# Patient Record
Sex: Female | Born: 1990 | Race: White | Marital: Married | Smoking: Never smoker
Health system: Southern US, Community
[De-identification: ages and names within clinical notes are randomized; demographics above are authoritative.]

## PROBLEM LIST (undated history)

## (undated) DIAGNOSIS — Z789 Other specified health status: Secondary | ICD-10-CM

## (undated) HISTORY — DX: Other specified health status: Z78.9

## (undated) HISTORY — PX: NO PAST SURGERIES: SHX2092

---

## 2016-12-03 ENCOUNTER — Ambulatory Visit (INDEPENDENT_AMBULATORY_CARE_PROVIDER_SITE_OTHER): Payer: Self-pay | Admitting: General Practice

## 2016-12-03 DIAGNOSIS — Z3201 Encounter for pregnancy test, result positive: Secondary | ICD-10-CM

## 2016-12-03 NOTE — Progress Notes (Signed)
Patient here for UPT today. UPT +. Patient reports first positive home test 11/25/16. LMP 10/10/16 EDD 07/17/17 4324w5d. Patient denies taking any medications or vitamins. Recommended she begin PNV & start care. Patient wishes to come here. New ob packet given. Patient had no questions

## 2017-01-07 ENCOUNTER — Encounter: Payer: Self-pay | Admitting: Obstetrics and Gynecology

## 2017-01-07 ENCOUNTER — Other Ambulatory Visit: Payer: Self-pay

## 2017-01-07 ENCOUNTER — Ambulatory Visit (INDEPENDENT_AMBULATORY_CARE_PROVIDER_SITE_OTHER): Payer: Self-pay

## 2017-01-07 VITALS — BP 127/78 | HR 85 | Ht 67.0 in | Wt 180.6 lb

## 2017-01-07 DIAGNOSIS — Z1371 Encounter for nonprocreative screening for genetic disease carrier status: Secondary | ICD-10-CM

## 2017-01-07 DIAGNOSIS — Z113 Encounter for screening for infections with a predominantly sexual mode of transmission: Secondary | ICD-10-CM

## 2017-01-07 DIAGNOSIS — Z124 Encounter for screening for malignant neoplasm of cervix: Secondary | ICD-10-CM

## 2017-01-07 DIAGNOSIS — Z3402 Encounter for supervision of normal first pregnancy, second trimester: Secondary | ICD-10-CM

## 2017-01-07 DIAGNOSIS — Z3400 Encounter for supervision of normal first pregnancy, unspecified trimester: Secondary | ICD-10-CM | POA: Insufficient documentation

## 2017-01-07 DIAGNOSIS — Z23 Encounter for immunization: Secondary | ICD-10-CM

## 2017-01-07 LAB — POCT URINALYSIS DIP (DEVICE)
BILIRUBIN URINE: NEGATIVE
GLUCOSE, UA: NEGATIVE mg/dL
Hgb urine dipstick: NEGATIVE
Ketones, ur: NEGATIVE mg/dL
LEUKOCYTES UA: NEGATIVE
NITRITE: NEGATIVE
PH: 6.5 (ref 5.0–8.0)
Protein, ur: NEGATIVE mg/dL
Specific Gravity, Urine: 1.01 (ref 1.005–1.030)
Urobilinogen, UA: 0.2 mg/dL (ref 0.0–1.0)

## 2017-01-07 MED ORDER — ASPIRIN EC 81 MG PO TBEC: 81.0000 mg | DELAYED_RELEASE_TABLET | Freq: Every day | ORAL | 5 refills | Status: DC

## 2017-01-07 NOTE — Addendum Note (Signed)
Addended by: Garret ReddishBARNES, Deandre Brannan M on: 01/07/2017 04:59 PM   Modules accepted: Orders, SmartSet

## 2017-01-07 NOTE — Progress Notes (Signed)
  Subjective:    Meghan Ibarra is being seen today for her first obstetrical visit.  This is a planned pregnancy. She is at 4351w5d gestation. Relationship with FOB: spouse, living together. Patient does intend to breast feed. Pregnancy history fully reviewed.  Patient reports no complaints.  Review of Systems:   Review of Systems  Constitutional: Negative.  Negative for fatigue and fever.  HENT: Negative.   Respiratory: Negative.  Negative for shortness of breath.   Cardiovascular: Negative.  Negative for chest pain.  Gastrointestinal: Negative.  Negative for abdominal pain, constipation, diarrhea, nausea and vomiting.  Genitourinary: Negative.  Negative for dysuria.  Neurological: Negative.  Negative for dizziness and headaches.    Objective:     BP 127/78   Pulse 85   Ht 5\' 7"  (1.702 m)   Wt 180 lb 9.6 oz (81.9 kg)   LMP 10/10/2016   BMI 28.29 kg/m  Physical Exam  Nursing note and vitals reviewed. Constitutional: She is oriented to person, place, and time. She appears well-developed and well-nourished. No distress.  HENT:  Head: Normocephalic.  Eyes: Pupils are equal, round, and reactive to light.  Cardiovascular: Normal rate, regular rhythm and normal heart sounds.  Respiratory: Effort normal and breath sounds normal. No respiratory distress.  GI: Soft. Bowel sounds are normal. She exhibits no distension. There is no tenderness.  Neurological: She is alert and oriented to person, place, and time.  Skin: Skin is warm and dry.  Psychiatric: She has a normal mood and affect. Her behavior is normal. Judgment and thought content normal.    Pelvic exam: Cervix pink, visually closed, without lesion, scant white creamy discharge, vaginal walls and external genitalia normal Bimanual exam: Cervix 0/long/high, firm, anterior, neg CMT, uterus nontender, adnexa without tenderness, enlargement, or mass  Exam Unable to hear FHT with doppler- Dr. Macon Ibarra performed u/s and confirmed  FHR   Assessment:    Pregnancy: G1P0 Patient Active Problem List   Diagnosis Date Noted  . Supervision of normal first pregnancy, antepartum 01/07/2017     Plan:     Initial labs drawn. Prenatal vitamins. 1hr GTT today- father with diabetes Aspirin 81mg  daily per Up to Date criteria- nulliparity and family hx preeclampsia Problem list reviewed and updated. AFP3 discussed: requested. Role of ultrasound in pregnancy discussed; fetal survey: requested. Amniocentesis discussed: not indicated. Follow up in 4 weeks.  Meghan Ibarra CNM 01/07/2017 4:47 PM

## 2017-01-08 LAB — OBSTETRIC PANEL, INCLUDING HIV
ANTIBODY SCREEN: NEGATIVE
BASOS: 0 %
Basophils Absolute: 0 10*3/uL (ref 0.0–0.2)
EOS (ABSOLUTE): 0.1 10*3/uL (ref 0.0–0.4)
EOS: 1 %
HEMATOCRIT: 36.4 % (ref 34.0–46.6)
HEMOGLOBIN: 12.2 g/dL (ref 11.1–15.9)
HIV Screen 4th Generation wRfx: NONREACTIVE
Hepatitis B Surface Ag: NEGATIVE
IMMATURE GRANS (ABS): 0 10*3/uL (ref 0.0–0.1)
Immature Granulocytes: 0 %
LYMPHS: 23 %
Lymphocytes Absolute: 1.9 10*3/uL (ref 0.7–3.1)
MCH: 29.9 pg (ref 26.6–33.0)
MCHC: 33.5 g/dL (ref 31.5–35.7)
MCV: 89 fL (ref 79–97)
MONOCYTES: 8 %
Monocytes Absolute: 0.7 10*3/uL (ref 0.1–0.9)
NEUTROS ABS: 5.4 10*3/uL (ref 1.4–7.0)
Neutrophils: 68 %
Platelets: 282 10*3/uL (ref 150–379)
RBC: 4.08 x10E6/uL (ref 3.77–5.28)
RDW: 13.9 % (ref 12.3–15.4)
RH TYPE: NEGATIVE
RPR Ser Ql: NONREACTIVE
RUBELLA: 3.71 {index} (ref >0.99)
WBC: 8 10*3/uL (ref 3.4–10.8)

## 2017-01-08 LAB — HEMOGLOBINOPATHY EVALUATION
Ferritin: 14 ng/mL — ABNORMAL LOW (ref 15–150)
HGB A2 QUANT: 2.3 % (ref 1.8–3.2)
HGB A: 96.9 % (ref 96.4–98.8)
HGB C: 0 %
HGB S: 0 %
HGB SOLUBILITY: NEGATIVE
HGB VARIANT: 0 %
Hgb F Quant: 0.8 % (ref 0.0–2.0)

## 2017-01-08 LAB — GLUCOSE TOLERANCE, 1 HOUR: Glucose, 1Hr PP: 70 mg/dL (ref 65–199)

## 2017-01-09 LAB — CULTURE, OB URINE

## 2017-01-09 LAB — URINE CULTURE, OB REFLEX

## 2017-01-12 LAB — CYTOLOGY - PAP
Chlamydia: NEGATIVE
DIAGNOSIS: NEGATIVE
Neisseria Gonorrhea: NEGATIVE

## 2017-01-16 ENCOUNTER — Encounter: Payer: Self-pay | Admitting: Obstetrics and Gynecology

## 2017-01-16 DIAGNOSIS — Z6791 Unspecified blood type, Rh negative: Secondary | ICD-10-CM | POA: Insufficient documentation

## 2017-01-16 DIAGNOSIS — O26899 Other specified pregnancy related conditions, unspecified trimester: Secondary | ICD-10-CM

## 2017-01-16 LAB — CYSTIC FIBROSIS MUTATION 97: GENE DIS ANAL CARRIER INTERP BLD/T-IMP: NOT DETECTED

## 2017-02-04 ENCOUNTER — Ambulatory Visit (INDEPENDENT_AMBULATORY_CARE_PROVIDER_SITE_OTHER): Payer: Self-pay

## 2017-02-04 VITALS — BP 126/80 | HR 84 | Wt 179.8 lb

## 2017-02-04 DIAGNOSIS — N949 Unspecified condition associated with female genital organs and menstrual cycle: Secondary | ICD-10-CM

## 2017-02-04 DIAGNOSIS — Z3492 Encounter for supervision of normal pregnancy, unspecified, second trimester: Secondary | ICD-10-CM

## 2017-02-04 DIAGNOSIS — Z3400 Encounter for supervision of normal first pregnancy, unspecified trimester: Secondary | ICD-10-CM

## 2017-02-04 DIAGNOSIS — Z3490 Encounter for supervision of normal pregnancy, unspecified, unspecified trimester: Secondary | ICD-10-CM

## 2017-02-04 NOTE — Progress Notes (Signed)
   PRENATAL VISIT NOTE  Subjective:  Meghan Ibarra is a 26 y.o. G1P0 at 7667w5d being seen today for ongoing prenatal care.  She is currently monitored for the following issues for this low-risk pregnancy and has Supervision of normal first pregnancy, antepartum and Rh negative, antepartum on their problem list.  Patient reports lower abdominal pain with movement. 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 Reports pain with movement and changing positions.  Contractions: Not present. Vag. Bleeding: None.   . Denies leaking of fluid.   The following portions of the patient's history were reviewed and updated as appropriate: allergies, current medications, past family history, past medical history, past social history, past surgical history and problem list. Problem list updated.  Objective:   Vitals:   02/04/17 0839  BP: 126/80  Pulse: 84  Weight: 179 lb 12.8 oz (81.6 kg)    Fetal Status: Fetal Heart Rate (bpm): 160 Fundal Height: 17 cm       General:  Alert, oriented and cooperative. Patient is in no acute distress.  Skin: Skin is warm and dry. No rash noted.   Cardiovascular: Normal heart rate noted  Respiratory: Normal respiratory effort, no problems with respiration noted  Abdomen: Soft, gravid, appropriate for gestational age.  Pain/Pressure: Present     Pelvic: Cervical exam deferred        Extremities: Normal range of motion.  Edema: None  Mental Status:  Normal mood and affect. Normal behavior. Normal judgment and thought content.   Assessment and Plan:  Pregnancy: G1P0 at 2067w5d  1. Encounter for supervision of low-risk pregnancy, antepartum -Initial OB labs reveiwed - AFP TETRA - US MFM OB COMP + 14 WK; Future  2. Round ligament pain -Encouraged patient to purchase maternity support belt -Increase water while at work  Preterm labor symptoms and general obstetric precautions including but not limited to vaginal bleeding, contractions,  leaking of fluid and fetal movement were reviewed in detail with the patient. Please refer to After Visit Summary for other counseling recommendations.  Return in about 4 weeks (around 03/04/2017) for Return OB visit.  Rolm BookbinderCaroline M Neill, CNM  02/04/17 8:59 AM

## 2017-02-04 NOTE — Patient Instructions (Signed)
Round Ligament Pain The round ligament is a cord of muscle and tissue that helps to support the uterus. It can become a source of pain during pregnancy if it becomes stretched or twisted as the baby grows. The pain usually begins in the second trimester of pregnancy, and it can come and go until the baby is delivered. It is not a serious problem, and it does not cause harm to the baby. Round ligament pain is usually a short, sharp, and pinching pain, but it can also be a dull, lingering, and aching pain. The pain is felt in the lower side of the abdomen or in the groin. It usually starts deep in the groin and moves up to the outside of the hip area. Pain can occur with:  A sudden change in position.  Rolling over in bed.  Coughing or sneezing.  Physical activity.  Follow these instructions at home: Watch your condition for any changes. Take these steps to help with your pain:  When the pain starts, relax. Then try: ? Sitting down. ? Flexing your knees up to your abdomen. ? Lying on your side with one pillow under your abdomen and another pillow between your legs. ? Sitting in a warm bath for 15-20 minutes or until the pain goes away.  Take over-the-counter and prescription medicines only as told by your health care provider.  Move slowly when you sit and stand.  Avoid long walks if they cause pain.  Stop or lessen your physical activities if they cause pain.  Contact a health care provider if:  Your pain does not go away with treatment.  You feel pain in your back that you did not have before.  Your medicine is not helping. Get help right away if:  You develop a fever or chills.  You develop uterine contractions.  You develop vaginal bleeding.  You develop nausea or vomiting.  You develop diarrhea.  You have pain when you urinate. This information is not intended to replace advice given to you by your health care provider. Make sure you discuss any questions you have  with your health care provider. Document Released: 11/13/2007 Document Revised: 07/12/2015 Document Reviewed: 04/12/2014 Elsevier Interactive Patient Education  2018 [employer] of Pregnancy The second trimester is from week 14 through week 27 (months 4 through 6). The second trimester is often a time when you feel your best. Your body has adjusted to being pregnant, and you begin to feel better physically. Usually, morning sickness has lessened or quit completely, you may have more energy, and you may have an increase in appetite. The second trimester is also a time when the fetus is growing rapidly. At the end of the sixth month, the fetus is about 9 inches long and weighs about 1 pounds. You will likely begin to feel the baby move (quickening) between 16 and 20 weeks of pregnancy. Body changes during your second trimester Your body continues to go through many changes during your second trimester. The changes vary from woman to woman.  Your weight will continue to increase. You will notice your lower abdomen bulging out.  You may begin to get stretch marks on your hips, abdomen, and breasts.  You may develop headaches that can be relieved by medicines. The medicines should be approved by your health care provider.  You may urinate more often because the fetus is pressing on your bladder.  You may develop or continue to have heartburn as a result of your  pregnancy.  You may develop constipation because certain hormones are causing the muscles that push waste through your intestines to slow down.  You may develop hemorrhoids or swollen, bulging veins (varicose veins).  You may have back pain. This is caused by: ? Weight gain. ? Pregnancy hormones that are relaxing the joints in your pelvis. ? A shift in weight and the muscles that support your balance.  Your breasts will continue to grow and they will continue to become tender.  Your gums may bleed and may be  sensitive to brushing and flossing.  Dark spots or blotches (chloasma, mask of pregnancy) may develop on your face. This will likely fade after the baby is born.  A dark line from your belly button to the pubic area (linea nigra) may appear. This will likely fade after the baby is born.  You may have changes in your hair. These can include thickening of your hair, rapid growth, and changes in texture. Some women also have hair loss during or after pregnancy, or hair that feels dry or thin. Your hair will most likely return to normal after your baby is born.  What to expect at prenatal visits During a routine prenatal visit:  You will be weighed to make sure you and the fetus are growing normally.  Your blood pressure will be taken.  Your abdomen will be measured to track your baby's growth.  The fetal heartbeat will be listened to.  Any test results from the previous visit will be discussed.  Your health care provider may ask you:  How you are feeling.  If you are feeling the baby move.  If you have had any abnormal symptoms, such as leaking fluid, bleeding, severe headaches, or abdominal cramping.  If you are using any tobacco products, including cigarettes, chewing tobacco, and electronic cigarettes.  If you have any questions.  Other tests that may be performed during your second trimester include:  Blood tests that check for: ? Low iron levels (anemia). ? High blood sugar that affects pregnant women (gestational diabetes) between 51 and 28 weeks. ? Rh antibodies. This is to check for a protein on red blood cells (Rh factor).  Urine tests to check for infections, diabetes, or protein in the urine.  An ultrasound to confirm the proper growth and development of the baby.  An amniocentesis to check for possible genetic problems.  Fetal screens for spina bifida and Down syndrome.  HIV (human immunodeficiency virus) testing. Routine prenatal testing includes screening  for HIV, unless you choose not to have this test.  Follow these instructions at home: Medicines  Follow your health care provider's instructions regarding medicine use. Specific medicines may be either safe or unsafe to take during pregnancy.  Take a prenatal vitamin that contains at least 600 micrograms (mcg) of folic acid.  If you develop constipation, try taking a stool softener if your health care provider approves. Eating and drinking  Eat a balanced diet that includes fresh fruits and vegetables, whole grains, good sources of protein such as meat, eggs, or tofu, and low-fat dairy. Your health care provider will help you determine the amount of weight gain that is right for you.  Avoid raw meat and uncooked cheese. These carry germs that can cause birth defects in the baby.  If you have low calcium intake from food, talk to your health care provider about whether you should take a daily calcium supplement.  Limit foods that are high in fat and processed sugars,  such as fried and sweet foods.  To prevent constipation: ? Drink enough fluid to keep your urine clear or pale yellow. ? Eat foods that are high in fiber, such as fresh fruits and vegetables, whole grains, and beans. Activity  Exercise only as directed by your health care provider. Most women can continue their usual exercise routine during pregnancy. Try to exercise for 30 minutes at least 5 days a week. Stop exercising if you experience uterine contractions.  Avoid heavy lifting, wear low heel shoes, and practice good posture.  A sexual relationship may be continued unless your health care provider directs you otherwise. Relieving pain and discomfort  Wear a good support bra to prevent discomfort from breast tenderness.  Take warm sitz baths to soothe any pain or discomfort caused by hemorrhoids. Use hemorrhoid cream if your health care provider approves.  Rest with your legs elevated if you have leg cramps or low  back pain.  If you develop varicose veins, wear support hose. Elevate your feet for 15 minutes, 3-4 times a day. Limit salt in your diet. Prenatal Care  Write down your questions. Take them to your prenatal visits.  Keep all your prenatal visits as told by your health care provider. This is important. Safety  Wear your seat belt at all times when driving.  Make a list of emergency phone numbers, including numbers for family, friends, the hospital, and police and fire departments. General instructions  Ask your health care provider for a referral to a local prenatal education class. Begin classes no later than the beginning of month 6 of your pregnancy.  Ask for help if you have counseling or nutritional needs during pregnancy. Your health care provider can offer advice or refer you to specialists for help with various needs.  Do not use hot tubs, steam rooms, or saunas.  Do not douche or use tampons or scented sanitary pads.  Do not cross your legs for long periods of time.  Avoid cat litter boxes and soil used by cats. These carry germs that can cause birth defects in the baby and possibly loss of the fetus by miscarriage or stillbirth.  Avoid all smoking, herbs, alcohol, and unprescribed drugs. Chemicals in these products can affect the formation and growth of the baby.  Do not use any products that contain nicotine or tobacco, such as cigarettes and e-cigarettes. If you need help quitting, ask your health care provider.  Visit your dentist if you have not gone yet during your pregnancy. Use a soft toothbrush to brush your teeth and be gentle when you floss. Contact a health care provider if:  You have dizziness.  You have mild pelvic cramps, pelvic pressure, or nagging pain in the abdominal area.  You have persistent nausea, vomiting, or diarrhea.  You have a bad smelling vaginal discharge.  You have pain when you urinate. Get help right away if:  You have a  fever.  You are leaking fluid from your vagina.  You have spotting or bleeding from your vagina.  You have severe abdominal cramping or pain.  You have rapid weight gain or weight loss.  You have shortness of breath with chest pain.  You notice sudden or extreme swelling of your face, hands, ankles, feet, or legs.  You have not felt your baby move in over an hour.  You have severe headaches that do not go away when you take medicine.  You have vision changes. Summary  The second trimester is from week  14 through week 27 (months 4 through 6). It is also a time when the fetus is growing rapidly.  Your body goes through many changes during pregnancy. The changes vary from woman to woman.  Avoid all smoking, herbs, alcohol, and unprescribed drugs. These chemicals affect the formation and growth your baby.  Do not use any tobacco products, such as cigarettes, chewing tobacco, and e-cigarettes. If you need help quitting, ask your health care provider.  Contact your health care provider if you have any questions. Keep all prenatal visits as told by your health care provider. This is important. This information is not intended to replace advice given to you by your health care provider. Make sure you discuss any questions you have with your health care provider. Document Released: 01/28/2001 Document Revised: 03/11/2016 Document Reviewed: 03/11/2016 Elsevier Interactive Patient Education  [employer]2018 [employer] Inc[employer]

## 2017-02-04 NOTE — Progress Notes (Signed)
Anatomy US scheduled for January 4th @0845 .  Pt notified.

## 2017-02-07 LAB — AFP TETRA
DIA Mom Value: 0.71
DIA VALUE (EIA): 107.53 pg/mL
DSR (By Age)    1 IN: 898
DSR (Second Trimester) 1 IN: 6980
GESTATIONAL AGE AFP: 16.7 wk
MSAFP Mom: 0.78
MSAFP: 24.6 ng/mL
MSHCG MOM: 1.06
MSHCG: 31374 m[IU]/mL
Maternal Age At EDD: 27.4 yr
Osb Risk: 10000
TEST RESULTS AFP: NEGATIVE
UE3 MOM: 0.95
Weight: 179 [lb_av]
uE3 Value: 0.87 ng/mL

## 2017-02-16 ENCOUNTER — Encounter (HOSPITAL_COMMUNITY): Payer: Self-pay

## 2017-02-17 NOTE — L&D Delivery Note (Addendum)
Delivery Note At 11:00 AM a viable female was delivered via Vaginal, Spontaneous (Presentation: OA).  APGAR: 8, 9; weight 8 lb 5.7 oz (3790 g).   Placenta status: intact; spontaneous via Tomasa BlaseSchultz.  Cord: 3 VC with no complications.  Cord pH: n/a  Anesthesia: epidural Episiotomy: none Lacerations: 4th degree by Dr. Alysia PennaErvin -- see addendum Suture Repair: 2.0 3.0 chromic vicryl Est. Blood Loss (mL): 250  Mom to postpartum.  Baby to Couplet care / Skin to Skin.  Raelyn Moraolitta Dawson, MSN, CNM 07/20/17, 12:42 PM    As noted above.    The 4th degree laceration was closed with 3/0 Chromic in a 2 layer fashion. The 3rd degree laceration was repaired with 2/0 Vicryl. The remainder of the vaginal mucosa and perineal body were closed with 2/0 and 3/0 Vicryl. There were also 2 small labial lacerations which were closed with 4/0 Vicryl. Rectal examined afterwards confirmed good closure and sphincter tone.  Pt was instructed on ice pack use as well as stool softener. She will be seen in the clinic in 1 week for a follow up visit.  Nettie ElmMichael Breyon Blass, MD

## 2017-02-20 ENCOUNTER — Other Ambulatory Visit: Payer: Self-pay

## 2017-02-20 ENCOUNTER — Ambulatory Visit (HOSPITAL_COMMUNITY)
Admission: RE | Admit: 2017-02-20 | Discharge: 2017-02-20 | Disposition: A | Discharge status: Home / Self Care | Payer: BLUE CROSS/BLUE SHIELD | Source: Ambulatory Visit

## 2017-02-20 DIAGNOSIS — Z369 Encounter for antenatal screening, unspecified: Secondary | ICD-10-CM

## 2017-02-20 DIAGNOSIS — Z3A19 19 weeks gestation of pregnancy: Secondary | ICD-10-CM

## 2017-02-20 DIAGNOSIS — Z363 Encounter for antenatal screening for malformations: Secondary | ICD-10-CM | POA: Diagnosis present

## 2017-02-20 DIAGNOSIS — Z3490 Encounter for supervision of normal pregnancy, unspecified, unspecified trimester: Secondary | ICD-10-CM

## 2017-03-04 ENCOUNTER — Ambulatory Visit (INDEPENDENT_AMBULATORY_CARE_PROVIDER_SITE_OTHER): Payer: BLUE CROSS/BLUE SHIELD

## 2017-03-04 VITALS — BP 116/87 | HR 99 | Wt 182.4 lb

## 2017-03-04 DIAGNOSIS — N949 Unspecified condition associated with female genital organs and menstrual cycle: Secondary | ICD-10-CM

## 2017-03-04 DIAGNOSIS — Z3402 Encounter for supervision of normal first pregnancy, second trimester: Secondary | ICD-10-CM

## 2017-03-04 DIAGNOSIS — Z3400 Encounter for supervision of normal first pregnancy, unspecified trimester: Secondary | ICD-10-CM

## 2017-03-04 NOTE — Progress Notes (Signed)
   PRENATAL VISIT NOTE  Subjective:  Meghan Ibarra is a 27 y.o. G1P0 at 5641w5d being seen today for ongoing prenatal care.  She is currently monitored for the following issues for this low-risk pregnancy and has Supervision of normal first pregnancy, antepartum and Rh negative, antepartum on their problem list.  Patient reports cold symptoms.  Contractions: Not present. Vag. Bleeding: None.  Movement: Present. Denies leaking of fluid.   The following portions of the patient's history were reviewed and updated as appropriate: allergies, current medications, past family history, past medical history, past social history, past surgical history and problem list. Problem list updated.  Objective:   Vitals:   03/04/17 0949  BP: 116/87  Pulse: 99  Weight: 182 lb 6.4 oz (82.7 kg)    Fetal Status: Fetal Heart Rate (bpm): 152 Fundal Height: 21 cm Movement: Present     General:  Alert, oriented and cooperative. Patient is in no acute distress.  Skin: Skin is warm and dry. No rash noted.   Cardiovascular: Normal heart rate noted  Respiratory: Normal respiratory effort, no problems with respiration noted  Abdomen: Soft, gravid, appropriate for gestational age.  Pain/Pressure: Absent     Pelvic: Cervical exam deferred        Extremities: Normal range of motion.  Edema: None  Mental Status:  Normal mood and affect. Normal behavior. Normal judgment and thought content.   Assessment and Plan:  Pregnancy: G1P0 at 6041w5d  1. Supervision of normal first pregnancy, antepartum -Reviewed results of anatomy ultrasound and need for follow up in 4 weeks - US MFM OB FOLLOW UP; Future  -List of safe OTC medications given, reviewed s/s of flu  2. Round ligament pain -Patient reports pain is better, only felt intermittently  Preterm labor symptoms and general obstetric precautions including but not limited to vaginal bleeding, contractions, leaking of fluid and fetal movement were reviewed in detail  with the patient. Please refer to After Visit Summary for other counseling recommendations.  Return in about 4 weeks (around 04/01/2017) for Return OB visit.  Meghan Ibarra, CNM 03/04/17 10:10 AM

## 2017-03-04 NOTE — Progress Notes (Signed)
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

## 2017-03-04 NOTE — Patient Instructions (Signed)

## 2017-03-18 ENCOUNTER — Other Ambulatory Visit: Payer: Self-pay

## 2017-03-18 ENCOUNTER — Ambulatory Visit (HOSPITAL_COMMUNITY)
Admission: RE | Admit: 2017-03-18 | Discharge: 2017-03-18 | Disposition: A | Discharge status: Home / Self Care | Payer: BLUE CROSS/BLUE SHIELD | Source: Ambulatory Visit

## 2017-03-18 DIAGNOSIS — Z3686 Encounter for antenatal screening for cervical length: Secondary | ICD-10-CM

## 2017-03-18 DIAGNOSIS — Z362 Encounter for other antenatal screening follow-up: Secondary | ICD-10-CM | POA: Insufficient documentation

## 2017-03-18 DIAGNOSIS — Z3402 Encounter for supervision of normal first pregnancy, second trimester: Secondary | ICD-10-CM | POA: Diagnosis not present

## 2017-03-18 DIAGNOSIS — Z3400 Encounter for supervision of normal first pregnancy, unspecified trimester: Secondary | ICD-10-CM

## 2017-03-18 DIAGNOSIS — Z3A22 22 weeks gestation of pregnancy: Secondary | ICD-10-CM

## 2017-04-01 ENCOUNTER — Encounter: Payer: Self-pay | Admitting: Medical

## 2017-04-01 ENCOUNTER — Ambulatory Visit (INDEPENDENT_AMBULATORY_CARE_PROVIDER_SITE_OTHER): Payer: BLUE CROSS/BLUE SHIELD | Admitting: Medical

## 2017-04-01 VITALS — BP 124/69 | HR 83 | Wt 190.4 lb

## 2017-04-01 DIAGNOSIS — Z3400 Encounter for supervision of normal first pregnancy, unspecified trimester: Secondary | ICD-10-CM

## 2017-04-01 DIAGNOSIS — O09892 Supervision of other high risk pregnancies, second trimester: Secondary | ICD-10-CM

## 2017-04-01 DIAGNOSIS — O26899 Other specified pregnancy related conditions, unspecified trimester: Secondary | ICD-10-CM

## 2017-04-01 DIAGNOSIS — Z3402 Encounter for supervision of normal first pregnancy, second trimester: Secondary | ICD-10-CM

## 2017-04-01 DIAGNOSIS — Z6791 Unspecified blood type, Rh negative: Secondary | ICD-10-CM

## 2017-04-01 NOTE — Progress Notes (Signed)
   PRENATAL VISIT NOTE  Subjective:  Wyatt PortelaBrittany Folden is a 27 y.o. G1P0 at [redacted]w[redacted]d being seen today for ongoing prenatal care.  She is currently monitored for the following issues for this low-risk pregnancy and has Supervision of normal first pregnancy, antepartum and Rh negative, antepartum on their problem list.  Patient reports no complaints.  Contractions: Not present. Vag. Bleeding: None.  Movement: Present. Denies leaking of fluid.   The following portions of the patient's history were reviewed and updated as appropriate: allergies, current medications, past family history, past medical history, past social history, past surgical history and problem list. Problem list updated.  Objective:   Vitals:   04/01/17 0817  BP: 124/69  Pulse: 83  Weight: 190 lb 6.4 oz (86.4 kg)    Fetal Status: Fetal Heart Rate (bpm): 158 Fundal Height: 25 cm Movement: Present     General:  Alert, oriented and cooperative. Patient is in no acute distress.  Skin: Skin is warm and dry. No rash noted.   Cardiovascular: Normal heart rate noted  Respiratory: Normal respiratory effort, no problems with respiration noted  Abdomen: Soft, gravid, appropriate for gestational age.  Pain/Pressure: Absent     Pelvic: Cervical exam deferred        Extremities: Normal range of motion.  Edema: None  Mental Status:  Normal mood and affect. Normal behavior. Normal judgment and thought content.   Assessment and Plan:  Pregnancy: G1P0 at 246w5d  1. Supervision of normal first pregnancy, antepartum - Doing well, no complaints - Discussed need for 2 hour GTT, CBC, HIV and RPR at next visit - Discussed TDap at next visit   2. Rh negative, antepartum - Discussed need for Rhogam at next visit  Preterm labor symptoms and general obstetric precautions including but not limited to vaginal bleeding, contractions, leaking of fluid and fetal movement were reviewed in detail with the patient. Please refer to After Visit Summary  for other counseling recommendations.  Return in about 4 weeks (around 04/29/2017) for LOB, 28 week labs (fasting).   Vonzella NippleJulie Schylar Allard, PA-C

## 2017-04-01 NOTE — Patient Instructions (Addendum)
Second Trimester of Pregnancy The second trimester is from week 13 through week 28, month 4 through 6. This is often the time in pregnancy that you feel your best. Often times, morning sickness has lessened or quit. You may have more energy, and you may get hungry more often. Your unborn baby (fetus) is growing rapidly. At the end of the sixth month, he or she is about 9 inches long and weighs about 1 pounds. You will likely feel the baby move (quickening) between 18 and 20 weeks of pregnancy. Follow these instructions at home:  Avoid all smoking, herbs, and alcohol. Avoid drugs not approved by your doctor.  Do not use any tobacco products, including cigarettes, chewing tobacco, and electronic cigarettes. If you need help quitting, ask your doctor. You may get counseling or other support to help you quit.  Only take medicine as told by your doctor. Some medicines are safe and some are not during pregnancy.  Exercise only as told by your doctor. Stop exercising if you start having cramps.  Eat regular, healthy meals.  Wear a good support bra if your breasts are tender.  Do not use hot tubs, steam rooms, or saunas.  Wear your seat belt when driving.  Avoid raw meat, uncooked cheese, and liter boxes and soil used by cats.  Take your prenatal vitamins.  Take 1500-2000 milligrams of calcium daily starting at the 20th week of pregnancy until you deliver your baby.  Try taking medicine that helps you poop (stool softener) as needed, and if your doctor approves. Eat more fiber by eating fresh fruit, vegetables, and whole grains. Drink enough fluids to keep your pee (urine) clear or pale yellow.  Take warm water baths (sitz baths) to soothe pain or discomfort caused by hemorrhoids. Use hemorrhoid cream if your doctor approves.  If you have puffy, bulging veins (varicose veins), wear support hose. Raise (elevate) your feet for 15 minutes, 3-4 times a day. Limit salt in your diet.  Avoid heavy  lifting, wear low heals, and sit up straight.  Rest with your legs raised if you have leg cramps or low back pain.  Visit your dentist if you have not gone during your pregnancy. Use a soft toothbrush to brush your teeth. Be gentle when you floss.  You can have sex (intercourse) unless your doctor tells you not to.  Go to your doctor visits. Get help if:  You feel dizzy.  You have mild cramps or pressure in your lower belly (abdomen).  You have a nagging pain in your belly area.  You continue to feel sick to your stomach (nauseous), throw up (vomit), or have watery poop (diarrhea).  You have bad smelling fluid coming from your vagina.  You have pain with peeing (urination). Get help right away if:  You have a fever.  You are leaking fluid from your vagina.  You have spotting or bleeding from your vagina.  You have severe belly cramping or pain.  You lose or gain weight rapidly.  You have trouble catching your breath and have chest pain.  You notice sudden or extreme puffiness (swelling) of your face, hands, ankles, feet, or legs.  You have not felt the baby move in over an hour.  You have severe headaches that do not go away with medicine.  You have vision changes. This information is not intended to replace advice given to you by your health care provider. Make sure you discuss any questions you have with your health care  provider. Document Released: 04/30/2009 Document Revised: 07/12/2015 Document Reviewed: 04/06/2012 Elsevier Interactive Patient Education  2017 [place] Education Options: [facility] Department Classes:  Childbirth education classes can help you get ready for a positive parenting experience. You can also meet other expectant parents and get free stuff for your baby. Each class runs for five weeks on the same night and costs $45 for the mother-to-be and her support person. Medicaid covers the cost if you are eligible.  Call [phone] to register. [facility] Childbirth Education:  [phone] or [phone] or [email or web address]  Baby & Me Class: Discuss newborn & infant parenting and family adjustment issues with other new mothers in a relaxed environment. Each week brings a new speaker or baby-centered activity. We encourage new mothers to join [place] every Thursday at 11:00am. Babies birth until crawling. No registration or fee. Daddy [employer]: This course offers Dads-to-be the tools and knowledge needed to feel confident on their journey to becoming new fathers. Experienced dads, who have been trained as coaches, teach dads-to-be how to hold, comfort, diaper, swaddle and play with their infant while being able to support the new mom as well. A class for men taught by men. $25/dad Big Brother/Big Sister: Let your children share in the joy of a new brother or sister in this special class designed just for them. Class includes discussion about how families care for babies: swaddling, holding, diapering, safety as well as how they can be helpful in their new role. This class is designed for children ages 67 to 18, but any age is welcome. Please register each child individually. $5/child  Mom Talk: This mom-led group offers support and connection to mothers as they journey through the adjustments and struggles of that sometimes overwhelming first year after the birth of a child. Tuesdays at 10:00am and Thursdays at 6:00pm. Babies welcome. No registration or fee. Breastfeeding Support Group: This group is a mother-to-mother support circle where moms have the opportunity to share their breastfeeding experiences. A Lactation Consultant is present for questions and concerns. Meets each Tuesday at 11:00am. No fee or registration. Breastfeeding Your Baby: Learn what to expect in the first days of breastfeeding your newborn.  This class will help you feel more confident with the skills needed to begin your  breastfeeding experience. Many new mothers are concerned about breastfeeding after leaving the hospital. This class will also address the most common fears and challenges about breastfeeding during the first few weeks, months and beyond. (call for fee) Comfort Techniques and Tour: This 2 hour interactive class will provide you the opportunity to learn & practice hands-on techniques that can help relieve some of the discomfort of labor and encourage your baby to rotate toward the best position for birth. You and your partner will be able to try a variety of labor positions with birth balls and rebozos as well as practice breathing, relaxation, and visualization techniques. A tour of the [facility] is included with this class. $20 per registrant and support person Childbirth Class- Weekend Option: This class is a Weekend version of our Birth & Baby series. It is designed for parents who have a difficult time fitting several weeks of classes into their schedule. It covers the care of your newborn and the basics of labor and childbirth. It also includes a [place] [facility] and lunch. The class is held two consecutive days: beginning on Friday evening from 6:30 - 8:30 p.m. and the  next day, Saturday from 9 a.m. - 4 p.m. (call for fee) Waterbirth Class: Interested in a waterbirth?  This informational class will help you discover whether waterbirth is the right fit for you. Education about waterbirth itself, supplies you would need and how to assemble your support team is what you can expect from this class. Some obstetrical practices require this class in order to pursue a waterbirth. (Not all obstetrical practices offer waterbirth-check with your healthcare provider.) Register only the expectant mom, but you are encouraged to bring your partner to class! Required if planning waterbirth, no fee. Infant/Child CPR: Parents, grandparents, babysitters, and friends  learn Cardio-Pulmonary Resuscitation skills for infants and children. You will also learn how to treat both conscious and unconscious choking in infants and children. This Family & Friends program does not offer certification. Register each participant individually to ensure that enough mannequins are available. (Call for fee) Grandparent Love: Expecting a grandbaby? This class is for you! Learn about the latest infant care and safety recommendations and ways to support your own child as he or she transitions into the parenting role. Taught by Registered Nurses who are childbirth instructors, but most importantly...they are grandmothers too! $10/person. Childbirth Class- Natural Childbirth: This series of 5 weekly classes is for expectant parents who want to learn and practice natural methods of coping with the process of labor and childbirth. Relaxation, breathing, massage, visualization, role of the partner, and helpful positioning are highlighted. Participants learn how to be confident in their body's ability to give birth. This class will empower and help parents make informed decisions about their own care. Includes discussion that will help new parents transition into the immediate postpartum period. [facility] is included. We suggest taking this class between 25-32 weeks, but it's only a recommendation. $75 per registrant and one support person or $30 Medicaid. Childbirth Class- 3 week Series: This option of 3 weekly classes helps you and your labor partner prepare for childbirth. Newborn care, labor & birth, cesarean birth, pain management, and comfort techniques are discussed and a [place] [facility] is included. The class meets at the same time, on the same day of the week for 3 consecutive weeks beginning with the starting date you choose. $60 for registrant and one support person.  Marvelous Multiples: Expecting twins, triplets, or more?  This class covers the differences in labor, birth, parenting, and breastfeeding issues that face multiples' parents. NICU tour is included. Led by a Certified Childbirth Educator who is the mother of twins. No fee. Caring for Baby: This class is for expectant and adoptive parents who want to learn and practice the most up-to-date newborn care for their babies. Focus is on birth through the first six weeks of life. Topics include feeding, bathing, diapering, crying, umbilical cord care, circumcision care and safe sleep. Parents learn to recognize symptoms of illness and when to call the pediatrician. Register only the mom-to-be and your partner or support person can plan to come with you! $10 per registrant and support person Childbirth Class- online option: This online class offers you the freedom to complete a Birth and Baby series in the comfort of your own home. The flexibility of this option allows you to review sections at your own pace, at times convenient to you and your support people. It includes additional video information, animations, quizzes, and extended activities. Get organized with helpful eClass tools, checklists, and trackers. Once you register online for the class,  you will receive an email within a few days to accept the invitation and begin the class when the time is right for you. The content will be available to you for 60 days. $60 for 60 days of online access for you and your support people.  Local Doulas: Natural Baby Doulas [email or web address] Tel: [phone] [email or web address] [employer] [phone] [email or web address] The Labor [facility]ell Halim  (also do waterbirth tub rental) [phone] [email or web address] [facility] Doula [phone] [email or web address].fi Sacred Rhythms  [phone] [email or web address] [employer] (PADA) [email or web address] [place] [facility]  [email or web address] Considering Waterbirth? Guide for patients at [facility] [employer]  Why consider waterbirth?  . Gentle birth for babies . Less pain medicine used in labor . May allow for passive descent/less pushing . May reduce perineal tears  . More mobility and instinctive maternal position changes . Increased maternal relaxation . Reduced blood pressure in labor  Is waterbirth safe? What are the risks of infection, drowning or other complications?  . Infection: o Very low risk (3.7 % for tub vs 4.8% for bed) o 7 in 8000 waterbirths with documented infection o Poorly cleaned equipment most common cause o Slightly lower group B strep transmission rate  . Drowning o Maternal:  - Very low risk   - Related to seizures or fainting o Newborn:  - Very low risk. No evidence of increased risk of respiratory problems in multiple large studies - Physiological protection from breathing under water - Avoid underwater birth if there are any fetal complications - Once baby's head is out of the water, keep it out.  . Birth complication o Some reports of cord trauma, but risk decreased by bringing baby to surface gradually o No evidence of increased risk of shoulder dystocia. Mothers can usually change positions faster in water than in a bed, possibly aiding the maneuvers to free the shoulder.   You must attend a Doren Custard class at [facility]  3rd Wednesday of every month from 7-9pm  [employer] by calling [phone] or online at [email or web address]  Bring [place] the certificate from the class to your prenatal appointment  Meet with a midwife at 36 weeks to see if you can still plan a waterbirth and to sign the consent.   Purchase or rent the following supplies:   Water Birth Pool (Birth Pool in a Box or [place] for instance)  (Tubs  start ~$125)  Single-use disposable tub liner designed for your brand of tub  New garden hose labeled "lead-free", "suitable for drinking water",  Electric drain pump to remove water (We recommend 792 gallon per hour or greater pump.)   Separate garden hose to remove the dirty water  Fish net  Bathing suit top (optional)  Long-handled mirror (optional)  Places to purchase or rent supplies  [email or web address] for tub purchases and supplies  Waterbirthsolutions.com for tub purchases and supplies  The Labor Ladies ([email or web address]) $275 for tub rental/set-up & take down/kit   [employer] (http://www.fleming.[email or web address]) Information regarding doulas (labor support) who provide pool rentals  Our practice has a Birth Pool in a Box tub at the hospital that you may borrow on a first-come-first-served basis. It is your responsibility to to set up, clean and break down the tub. We cannot guarantee the availability of this tub in advance. You are responsible for bringing all accessories listed above. If you do not have all necessary supplies you cannot have a waterbirth.    Things that would prevent you from  having a waterbirth:  Premature, <37wks  Previous cesarean birth  Presence of thick meconium-stained fluid  Multiple gestation (Twins, triplets, etc.)  Uncontrolled diabetes or gestational diabetes requiring medication  Hypertension requiring medication or diagnosis of pre-eclampsia  Heavy vaginal bleeding  Non-reassuring fetal heart rate  Active infection (MRSA, etc.). Group B Strep is NOT a contraindication for  waterbirth.  If your labor has to be induced and induction method requires continuous  monitoring of the baby's heart rate  Other risks/issues identified by your obstetrical provider  Please remember that birth is unpredictable. Under certain unforeseeable circumstances your provider may advise against giving birth in the tub. These  decisions will be made on a case-by-case basis and with the safety of you and your baby as our highest priority.

## 2017-04-27 ENCOUNTER — Other Ambulatory Visit: Payer: Self-pay | Admitting: General Practice

## 2017-04-27 DIAGNOSIS — Z3400 Encounter for supervision of normal first pregnancy, unspecified trimester: Secondary | ICD-10-CM

## 2017-04-28 ENCOUNTER — Other Ambulatory Visit: Payer: BLUE CROSS/BLUE SHIELD

## 2017-04-28 ENCOUNTER — Encounter: Payer: Self-pay | Admitting: Medical

## 2017-04-28 ENCOUNTER — Ambulatory Visit (INDEPENDENT_AMBULATORY_CARE_PROVIDER_SITE_OTHER): Payer: BLUE CROSS/BLUE SHIELD | Admitting: Medical

## 2017-04-28 VITALS — BP 121/76 | HR 86 | Wt 194.2 lb

## 2017-04-28 DIAGNOSIS — O360930 Maternal care for other rhesus isoimmunization, third trimester, not applicable or unspecified: Secondary | ICD-10-CM

## 2017-04-28 DIAGNOSIS — O26899 Other specified pregnancy related conditions, unspecified trimester: Secondary | ICD-10-CM

## 2017-04-28 DIAGNOSIS — Z3403 Encounter for supervision of normal first pregnancy, third trimester: Secondary | ICD-10-CM

## 2017-04-28 DIAGNOSIS — Z23 Encounter for immunization: Secondary | ICD-10-CM | POA: Diagnosis not present

## 2017-04-28 DIAGNOSIS — Z3400 Encounter for supervision of normal first pregnancy, unspecified trimester: Secondary | ICD-10-CM

## 2017-04-28 DIAGNOSIS — Z6791 Unspecified blood type, Rh negative: Secondary | ICD-10-CM

## 2017-04-28 MED ORDER — RHO D IMMUNE GLOBULIN 1500 UNIT/2ML IJ SOSY
300.0000 ug | PREFILLED_SYRINGE | Freq: Once | INTRAMUSCULAR | Status: AC
  Administered 2017-04-28: 300 ug via INTRAMUSCULAR

## 2017-04-28 NOTE — Patient Instructions (Addendum)
Fetal Movement Counts Patient Name: ________________________________________________ Patient Due Date: ____________________ What is a fetal movement count? A fetal movement count is the number of times that you feel your baby move during a certain amount of time. This may also be called a fetal kick count. A fetal movement count is recommended for every pregnant woman. You may be asked to start counting fetal movements as early as week 28 of your pregnancy. Pay attention to when your baby is most active. You may notice your baby's sleep and wake cycles. You may also notice things that make your baby move more. You should do a fetal movement count:  When your baby is normally most active.  At the same time each day.  A good time to count movements is while you are resting, after having something to eat and drink. How do I count fetal movements? 1. Find a quiet, comfortable area. Sit, or lie down on your side. 2. Write down the date, the start time and stop time, and the number of movements that you felt between those two times. Take this information with you to your health care visits. 3. For 2 hours, count kicks, flutters, swishes, rolls, and jabs. You should feel at least 10 movements during 2 hours. 4. You may stop counting after you have felt 10 movements. 5. If you do not feel 10 movements in 2 hours, have something to eat and drink. Then, keep resting and counting for 1 hour. If you feel at least 4 movements during that hour, you may stop counting. Contact a health care provider if:  You feel fewer than 4 movements in 2 hours.  Your baby is not moving like he or she usually does. Date: ____________ Start time: ____________ Stop time: ____________ Movements: ____________ Date: ____________ Start time: ____________ Stop time: ____________ Movements: ____________ Date: ____________ Start time: ____________ Stop time: ____________ Movements: ____________ Date: ____________ Start time:  ____________ Stop time: ____________ Movements: ____________ Date: ____________ Start time: ____________ Stop time: ____________ Movements: ____________ Date: ____________ Start time: ____________ Stop time: ____________ Movements: ____________ Date: ____________ Start time: ____________ Stop time: ____________ Movements: ____________ Date: ____________ Start time: ____________ Stop time: ____________ Movements: ____________ Date: ____________ Start time: ____________ Stop time: ____________ Movements: ____________ This information is not intended to replace advice given to you by your health care provider. Make sure you discuss any questions you have with your health care provider. Document Released: 03/05/2006 Document Revised: 10/03/2015 Document Reviewed: 03/15/2015 Elsevier Interactive Patient Education  2018 [employer].  [employer] of the uterus can occur throughout pregnancy, but they are not always a sign that you are in labor. You may have practice contractions called Braxton Hicks contractions. These false labor contractions are sometimes confused with true labor. What are [place] contractions? Braxton Hicks contractions are tightening movements that occur in the muscles of the uterus before labor. Unlike true labor contractions, these contractions do not result in opening (dilation) and thinning of the cervix. Toward the end of pregnancy (32-34 weeks), Braxton Hicks contractions can happen more often and may become stronger. These contractions are sometimes difficult to tell apart from true labor because they can be very uncomfortable. You should not feel embarrassed if you go to the hospital with false labor. Sometimes, the only way to tell if you are in true labor is for your health care provider to look for changes in the cervix. The health care provider will do a physical exam and may monitor your contractions.  If you are not in true labor, the exam  should show that your cervix is not dilating and your water has not broken. If there are other health problems associated with your pregnancy, it is completely safe for you to be sent home with false labor. You may continue to have Braxton Hicks contractions until you go into true labor. How to tell the difference between true labor and false labor True labor  Contractions last 30-70 seconds.  Contractions become very regular.  Discomfort is usually felt in the top of the uterus, and it spreads to the lower abdomen and low back.  Contractions do not go away with walking.  Contractions usually become more intense and increase in frequency.  The cervix dilates and gets thinner. False labor  Contractions are usually shorter and not as strong as true labor contractions.  Contractions are usually irregular.  Contractions are often felt in the front of the lower abdomen and in the groin.  Contractions may go away when you walk around or change positions while lying down.  Contractions get weaker and are shorter-lasting as time goes on.  The cervix usually does not dilate or become thin. Follow these instructions at home:  Take over-the-counter and prescription medicines only as told by your health care provider.  Keep up with your usual exercises and follow other instructions from your health care provider.  Eat and drink lightly if you think you are going into labor.  If Braxton Hicks contractions are making you uncomfortable: ? Change your position from lying down or resting to walking, or change from walking to resting. ? Sit and rest in a tub of warm water. ? Drink enough fluid to keep your urine pale yellow. Dehydration may cause these contractions. ? Do slow and deep breathing several times an hour.  Keep all follow-up prenatal visits as told by your health care provider. This is important. Contact a health care provider if:  You have a fever.  You have continuous pain  in your abdomen. Get help right away if:  Your contractions become stronger, more regular, and closer together.  You have fluid leaking or gushing from your vagina.  You pass blood-tinged mucus (bloody show).  You have bleeding from your vagina.  You have low back pain that you never had before.  You feel your baby's head pushing down and causing pelvic pressure.  Your baby is not moving inside you as much as it used to. Summary  Contractions that occur before labor are called Braxton Hicks contractions, false labor, or practice contractions.  Braxton Hicks contractions are usually shorter, weaker, farther apart, and less regular than true labor contractions. True labor contractions usually become progressively stronger and regular and they become more frequent.  Manage discomfort from [facility] contractions by changing position, resting in a warm bath, drinking plenty of water, or practicing deep breathing. This information is not intended to replace advice given to you by your health care provider. Make sure you discuss any questions you have with your health care provider. Document Released: 06/19/2016 Document Revised: 06/19/2016 Document Reviewed: 06/19/2016 Elsevier Interactive Patient Education  2018 [employer] Inc[employer]   Rh Incompatibility Rh incompatibility is a condition that occurs during pregnancy if a woman has Rh-negative blood and her baby has Rh-positive blood. "Rh-negative" and "Rh-positive" refer to whether or not the blood has an Rh factor. An Rh factor is a specific protein found on the surface of red blood cells. If a woman has Rh factor,  she is Rh-positive. If she does not have an Rh factor, she is Rh-negative. Having or not having an Rh factor does not affect the mother's general health. However, it can cause problems during pregnancy. What kind of problems can Rh incompatibility cause? During pregnancy, blood from the baby can cross into the mother's bloodstream,  especially during delivery. If a mother is Rh-negative and the baby is Rh-positive, the mother's defense system will react to the baby's blood as if it was a foreign substance and will create proteins (antibodies). This is called sensitization. Once the mother is sensitized, her Rh antibodies will cross the placenta to the baby and attack the baby's Rh-positive blood as if it is a harmful substance. Rh incompatibility can also happen if the Rh-negative pregnant woman is exposed to the Rh factor during a blood transfusion with Rh-positive blood. How does this condition affect my baby? The Rh antibodies that attack and destroy the baby's red blood cells can lead to hemolytic disease in the baby. Hemolytic disease is when the red blood cells break down. This can cause:  Yellowing of the skin and eyes (jaundice).  The body to not have enough healthy red blood cells (anemia).  Brain damage.  Heart failure.  Death.  These antibodies usually do not cause problems during a first pregnancy. This is because the blood from the baby often times crosses into the mother's bloodstream during delivery, and the baby is born before many of the antibodies can develop. However, the antibodies stay in your body once they have formed. Because of this, Rh incompatibility is more likely to cause problems in second or later pregnancies (if the baby is Rh-positive). How is this diagnosed? When a woman becomes pregnant, blood tests may be done to find out her blood type and Rh factor. If the woman is Rh-negative, she also may have another blood test called an antibody screen. The antibody screen shows whether she has Rh antibodies in her blood. If she does, it means she was exposed to Rh-positive blood before, and she is at risk for Rh incompatibility. To find out whether the baby is developing hemolytic anemia and how serious it is, caregivers may use more advanced tests, such as ultrasonography (commonly known as  ultrasound). How is Rh incompatibility treated? Rh incompatibility is treated with a shot of medicine called Rho (D) immune globulin. This medicine keeps the woman's body from making antibodies that can cause serious problems in the baby or future babies. Two shots will be given, one at around your seventh month of pregnancy and the other within 72 hours of your baby being born. If you are Rh-negative, you will need this medicine every time you have a baby with Rh-positive blood. If you already have antibodies in your blood, Rho (D) immune globulin will not help. Your doctor will not give you this medicine, but will watch your pregnancy closely for problems instead. This shot may also be given to an Rh-negative woman when the risk of blood transfer between the mom and baby is high. The risk is high with:  An amniocentesis.  A miscarriage or an abortion.  An ectopic pregnancy.  Any vaginal bleeding during pregnancy.  This information is not intended to replace advice given to you by your health care provider. Make sure you discuss any questions you have with your health care provider. Document Released: 07/26/2001 Document Revised: 07/12/2015 Document Reviewed: 05/18/2012 Elsevier Interactive Patient Education  2017 [employer]

## 2017-04-28 NOTE — Progress Notes (Signed)
Tdap given today @ 8:45 in right arm

## 2017-04-28 NOTE — Progress Notes (Signed)
   PRENATAL VISIT NOTE  Subjective:  Meghan Ibarra is a 27 y.o. G1P0 at 6232w4d being seen today for ongoing prenatal care.  She is currently monitored for the following issues for this low-risk pregnancy and has Supervision of normal first pregnancy, antepartum and Rh negative, antepartum on their problem list.  Patient reports no complaints.  Contractions: Not present. Vag. Bleeding: None.  Movement: Present. Denies leaking of fluid.   The following portions of the patient's history were reviewed and updated as appropriate: allergies, current medications, past family history, past medical history, past social history, past surgical history and problem list. Problem list updated.  Objective:   Vitals:   04/28/17 0832  BP: 121/76  Pulse: 86  Weight: 194 lb 3.2 oz (88.1 kg)    Fetal Status: Fetal Heart Rate (bpm): 159 Fundal Height: 28 cm Movement: Present     General:  Alert, oriented and cooperative. Patient is in no acute distress.  Skin: Skin is warm and dry. No rash noted.   Cardiovascular: Normal heart rate noted  Respiratory: Normal respiratory effort, no problems with respiration noted  Abdomen: Soft, gravid, appropriate for gestational age.  Pain/Pressure: Absent     Pelvic: Cervical exam deferred        Extremities: Normal range of motion.  Edema: Trace  Mental Status:  Normal mood and affect. Normal behavior. Normal judgment and thought content.   Assessment and Plan:  Pregnancy: G1P0 at 6132w4d  1. Supervision of normal first pregnancy, antepartum - Tdap vaccine greater than or equal to 7yo IM - 2 hour GTT, CBC, HIV and RPR today   2. Rh negative, antepartum - rho (d) immune globulin (RHIG/RHOPHYLAC) injection 300 mcg  Preterm labor symptoms and general obstetric precautions including but not limited to vaginal bleeding, contractions, leaking of fluid and fetal movement were reviewed in detail with the patient. Please refer to After Visit Summary for other  counseling recommendations.  Return in about 2 weeks (around 05/12/2017) for LOB.   Vonzella NippleJulie Auriah Hollings, PA-C

## 2017-04-29 LAB — CBC
HEMOGLOBIN: 11.5 g/dL (ref 11.1–15.9)
Hematocrit: 36 % (ref 34.0–46.6)
MCH: 30.3 pg (ref 26.6–33.0)
MCHC: 31.9 g/dL (ref 31.5–35.7)
MCV: 95 fL (ref 79–97)
Platelets: 258 10*3/uL (ref 150–379)
RBC: 3.8 x10E6/uL (ref 3.77–5.28)
RDW: 14.7 % (ref 12.3–15.4)
WBC: 8.9 10*3/uL (ref 3.4–10.8)

## 2017-04-29 LAB — GLUCOSE TOLERANCE, 2 HOURS W/ 1HR
GLUCOSE, 1 HOUR: 117 mg/dL (ref 65–179)
GLUCOSE, 2 HOUR: 115 mg/dL (ref 65–152)
Glucose, Fasting: 76 mg/dL (ref 65–91)

## 2017-04-29 LAB — RPR: RPR: NONREACTIVE

## 2017-04-29 LAB — HIV ANTIBODY (ROUTINE TESTING W REFLEX): HIV SCREEN 4TH GENERATION: NONREACTIVE

## 2017-05-13 ENCOUNTER — Encounter: Payer: Self-pay | Admitting: Medical

## 2017-05-13 ENCOUNTER — Ambulatory Visit (INDEPENDENT_AMBULATORY_CARE_PROVIDER_SITE_OTHER): Payer: BLUE CROSS/BLUE SHIELD | Admitting: Medical

## 2017-05-13 VITALS — BP 121/69 | HR 86 | Wt 200.6 lb

## 2017-05-13 DIAGNOSIS — O26899 Other specified pregnancy related conditions, unspecified trimester: Secondary | ICD-10-CM

## 2017-05-13 DIAGNOSIS — O09899 Supervision of other high risk pregnancies, unspecified trimester: Secondary | ICD-10-CM

## 2017-05-13 DIAGNOSIS — Z6791 Unspecified blood type, Rh negative: Secondary | ICD-10-CM

## 2017-05-13 DIAGNOSIS — Z3400 Encounter for supervision of normal first pregnancy, unspecified trimester: Secondary | ICD-10-CM

## 2017-05-13 NOTE — Patient Instructions (Signed)

## 2017-05-13 NOTE — Progress Notes (Signed)
   PRENATAL VISIT NOTE  Subjective:  Meghan Ibarra is a [age] y.o. G1P0 at 10710w5d being seen today for ongoing prenatal care.  She is currently monitored for the following issues for this low-risk pregnancy and has Supervision of normal first pregnancy, antepartum and Rh negative, antepartum on their problem list.  Patient reports no complaints.  Contractions: Not present. Vag. Bleeding: None.  Movement: Present. Denies leaking of fluid.   The following portions of the patient's history were reviewed and updated as appropriate: allergies, current medications, past family history, past medical history, past social history, past surgical history and problem list. Problem list updated.  Objective:   Vitals:   05/13/17 1501  BP: 121/69  Pulse: 86  Weight: 200 lb 9.6 oz (91 kg)    Fetal Status: Fetal Heart Rate (bpm): 156 Fundal Height: 31 cm Movement: Present     General:  Alert, oriented and cooperative. Patient is in no acute distress.  Skin: Skin is warm and dry. No rash noted.   Cardiovascular: Normal heart rate noted  Respiratory: Normal respiratory effort, no problems with respiration noted  Abdomen: Soft, gravid, appropriate for gestational age.  Pain/Pressure: Absent     Pelvic: Cervical exam deferred        Extremities: Normal range of motion.  Edema: Trace  Mental Status:  Normal mood and affect. Normal behavior. Normal judgment and thought content.   Assessment and Plan:  Pregnancy: G1P0 at 6410w5d  1. Supervision of normal first pregnancy, antepartum  2. Rh negative, antepartum - Rhogam PPX at last visit, will receive PP as well  Preterm labor symptoms and general obstetric precautions including but not limited to vaginal bleeding, contractions, leaking of fluid and fetal movement were reviewed in detail with the patient. Please refer to After Visit Summary for other counseling recommendations.  Return in about 2 weeks (around 05/27/2017) for LOB.   Vonzella NippleJulie Esly Selvage,  PA-C

## 2017-05-26 ENCOUNTER — Ambulatory Visit (INDEPENDENT_AMBULATORY_CARE_PROVIDER_SITE_OTHER): Payer: BLUE CROSS/BLUE SHIELD | Admitting: Medical

## 2017-05-26 VITALS — BP 111/77 | HR 93 | Wt 201.1 lb

## 2017-05-26 DIAGNOSIS — O26893 Other specified pregnancy related conditions, third trimester: Secondary | ICD-10-CM

## 2017-05-26 DIAGNOSIS — Z6791 Unspecified blood type, Rh negative: Secondary | ICD-10-CM

## 2017-05-26 DIAGNOSIS — O26899 Other specified pregnancy related conditions, unspecified trimester: Secondary | ICD-10-CM

## 2017-05-26 DIAGNOSIS — Z3400 Encounter for supervision of normal first pregnancy, unspecified trimester: Secondary | ICD-10-CM

## 2017-05-26 DIAGNOSIS — Z3403 Encounter for supervision of normal first pregnancy, third trimester: Secondary | ICD-10-CM

## 2017-05-26 NOTE — Patient Instructions (Signed)

## 2017-05-26 NOTE — Progress Notes (Signed)
   PRENATAL VISIT NOTE  Subjective:  Meghan Ibarra is a 27 y.o. G1P0 at 5066w4d being seen today for ongoing prenatal care.  She is currently monitored for the following issues for this low-risk pregnancy and has Supervision of normal first pregnancy, antepartum and Rh negative, antepartum on their problem list.  Patient reports occasional contractions.  Contractions: Irritability. Vag. Bleeding: None.  Movement: Present. Denies leaking of fluid.   The following portions of the patient's history were reviewed and updated as appropriate: allergies, current medications, past family history, past medical history, past social history, past surgical history and problem list. Problem list updated.  Objective:   Vitals:   05/26/17 1017  BP: 111/77  Pulse: 93  Weight: 201 lb 1.6 oz (91.2 kg)    Fetal Status: Fetal Heart Rate (bpm): 153 Fundal Height: 31 cm Movement: Present     General:  Alert, oriented and cooperative. Patient is in no acute distress.  Skin: Skin is warm and dry. No rash noted.   Cardiovascular: Normal heart rate noted  Respiratory: Normal respiratory effort, no problems with respiration noted  Abdomen: Soft, gravid, appropriate for gestational age.  Pain/Pressure: Present     Pelvic: Cervical exam deferred        Extremities: Normal range of motion.  Edema: Trace  Mental Status: Normal mood and affect. Normal behavior. Normal judgment and thought content.   Assessment and Plan:  Pregnancy: G1P0 at 6966w4d  1. Supervision of normal first pregnancy, antepartum - Doing well, few BH ctx, infrequent - Denies bleeding or LOF - Normal FM  2. Rh negative, antepartum - Had Rhogam at 28 weeks  Preterm labor symptoms and general obstetric precautions including but not limited to vaginal bleeding, contractions, leaking of fluid and fetal movement were reviewed in detail with the patient. Please refer to After Visit Summary for other counseling recommendations.  Return in  about 2 weeks (around 06/09/2017) for LOB.  Future Appointments  Date Time Provider Department Center  06/08/2017  8:55 AM Armando ReichertHogan, Heather D, CNM [facility]    Meghan NippleJulie Wenzel, PA-C

## 2017-06-08 ENCOUNTER — Ambulatory Visit (INDEPENDENT_AMBULATORY_CARE_PROVIDER_SITE_OTHER): Payer: BLUE CROSS/BLUE SHIELD | Admitting: Advanced Practice Midwife

## 2017-06-08 ENCOUNTER — Encounter: Payer: Self-pay | Admitting: Advanced Practice Midwife

## 2017-06-08 VITALS — BP 122/78 | HR 90 | Wt 204.1 lb

## 2017-06-08 DIAGNOSIS — Z3400 Encounter for supervision of normal first pregnancy, unspecified trimester: Secondary | ICD-10-CM

## 2017-06-08 DIAGNOSIS — Z3403 Encounter for supervision of normal first pregnancy, third trimester: Secondary | ICD-10-CM

## 2017-06-08 NOTE — Patient Instructions (Addendum)
Labor Information  Vaginal delivery means that you will give birth by pushing your baby out of your birth canal (vagina). A team of health care providers will help you before, during, and after vaginal delivery. Birth experiences are unique for every woman and every pregnancy, and birth experiences vary depending on where you choose to give birth. What should I do to prepare for my baby's birth? Before your baby is born, it is important to talk with your health care provider about:  Your labor and delivery preferences. These may include: ? Medicines that you may be given. ? How you will manage your pain. This might include non-medical pain relief techniques or injectable pain relief such as epidural analgesia. ? How you and your baby will be monitored during labor and delivery. ? Who may be in the labor and delivery room with you. ? Your feelings about surgical delivery of your baby (cesarean delivery, or C-section) if this becomes necessary. ? Your feelings about receiving donated blood through an IV tube (blood transfusion) if this becomes necessary.  Whether you are able: ? To take pictures or videos of the birth. ? To eat during labor and delivery. ? To move around, walk, or change positions during labor and delivery.  What to expect after your baby is born, such as: ? Whether delayed umbilical cord clamping and cutting is offered. ? Who will care for your baby right after birth. ? Medicines or tests that may be recommended for your baby. ? Whether breastfeeding is supported in your hospital or birth center. ? How long you will be in the hospital or birth center.  How any medical conditions you have may affect your baby or your labor and delivery experience.  To prepare for your baby's birth, you should also:  Attend all of your health care visits before delivery (prenatal visits) as recommended by your health care provider. This is important.  Prepare your home for your baby's  arrival. Make sure that you have: ? Diapers. ? Baby clothing. ? Feeding equipment. ? Safe sleeping arrangements for you and your baby.  Install a car seat in your vehicle. Have your car seat checked by a certified car seat installer to make sure that it is installed safely.  Think about who will help you with your new baby at home for at least the first several weeks after delivery.  What can I expect when I arrive at the birth center or hospital? Once you are in labor and have been admitted into the hospital or birth center, your health care provider may:  Review your pregnancy history and any concerns you have.  Insert an IV tube into one of your veins. This is used to give you fluids and medicines.  Check your blood pressure, pulse, temperature, and heart rate (vital signs).  Check whether your bag of water (amniotic sac) has broken (ruptured).  Talk with you about your birth plan and discuss pain control options.  Monitoring Your health care provider may monitor your contractions (uterine monitoring) and your baby's heart rate (fetal monitoring). You may need to be monitored:  Often, but not continuously (intermittently).  All the time or for long periods at a time (continuously). Continuous monitoring may be needed if: ? You are taking certain medicines, such as medicine to relieve pain or make your contractions stronger. ? You have pregnancy or labor complications.  Monitoring may be done by:  Placing a special stethoscope or a handheld monitoring device on your abdomen  check your baby's heartbeat, and feeling your abdomen for contractions. This method of monitoring does not continuously record your baby's heartbeat or your contractions.  Placing monitors on your abdomen (external monitors) to record your baby's heartbeat and the frequency and length of contractions. You may not have to wear external monitors all the time.  Placing monitors inside of your uterus  (internal monitors) to record your baby's heartbeat and the frequency, length, and strength of your contractions. ? Your health care provider may use internal monitors if he or she needs more information about the strength of your contractions or your baby's heart rate. ? Internal monitors are put in place by passing a thin, flexible wire through your vagina and into your uterus. Depending on the type of monitor, it may remain in your uterus or on your baby's head until birth. ? Your health care provider will discuss the benefits and risks of internal monitoring with you and will ask for your permission before inserting the monitors.  Telemetry. This is a type of continuous monitoring that can be done with external or internal monitors. Instead of having to stay in bed, you are able to move around during telemetry. Ask your health care provider if telemetry is an option for you.  Physical exam Your health care provider may perform a physical exam. This may include:  Checking whether your baby is positioned: ? With the head toward your vagina (head-down). This is most common. ? With the head toward the top of your uterus (head-up or breech). If your baby is in a breech position, your health care provider may try to turn your baby to a head-down position so you can deliver vaginally. If it does not seem that your baby can be born vaginally, your provider may recommend surgery to deliver your baby. In rare cases, you may be able to deliver vaginally if your baby is head-up (breech delivery). ? Lying sideways (transverse). Babies that are lying sideways cannot be delivered vaginally.  Checking your cervix to determine: ? Whether it is thinning out (effacing). ? Whether it is opening up (dilating). ? How low your baby has moved into your birth canal.  What are the three stages of labor and delivery?  Normal labor and delivery is divided into the following three stages: Stage 1  Stage 1 is the  longest stage of labor, and it can last for hours or days. Stage 1 includes: ? Early labor. This is when contractions may be irregular, or regular and mild. Generally, early labor contractions are more than 10 minutes apart. ? Active labor. This is when contractions get longer, more regular, more frequent, and more intense. ? The transition phase. This is when contractions happen very close together, are very intense, and may last longer than during any other part of labor.  Contractions generally feel mild, infrequent, and irregular at first. They get stronger, more frequent (about every 2-3 minutes), and more regular as you progress from early labor through active labor and transition.  Many women progress through stage 1 naturally, but you may need help to continue making progress. If this happens, your health care provider may talk with you about: ? Rupturing your amniotic sac if it has not ruptured yet. ? Giving you medicine to help make your contractions stronger and more frequent.  Stage 1 ends when your cervix is completely dilated to 4 inches (10 cm) and completely effaced. This happens at the end of the transition phase. Stage 2  Once   your cervix is completely effaced and dilated to 4 inches (10 cm), you may start to feel an urge to push. It is common for the body to naturally take a rest before feeling the urge to push, especially if you received an epidural or certain other pain medicines. This rest period may last for up to 1-2 hours, depending on your unique labor experience.  During stage 2, contractions are generally less painful, because pushing helps relieve contraction pain. Instead of contraction pain, you may feel stretching and burning pain, especially when the widest part of your baby's head passes through the vaginal opening (crowning).  Your health care provider will closely monitor your pushing progress and your baby's progress through the vagina during stage 2.  Your  health care provider may massage the area of skin between your vaginal opening and anus (perineum) or apply warm compresses to your perineum. This helps it stretch as the baby's head starts to crown, which can help prevent perineal tearing. ? In some cases, an incision may be made in your perineum (episiotomy) to allow the baby to pass through the vaginal opening. An episiotomy helps to make the opening of the vagina larger to allow more room for the baby to fit through.  It is very important to breathe and focus so your health care provider can control the delivery of your baby's head. Your health care provider may have you decrease the intensity of your pushing, to help prevent perineal tearing.  After delivery of your baby's head, the shoulders and the rest of the body generally deliver very quickly and without difficulty.  Once your baby is delivered, the umbilical cord may be cut right away, or this may be delayed for 1-2 minutes, depending on your baby's health. This may vary among health care providers, hospitals, and birth centers.  If you and your baby are healthy enough, your baby may be placed on your chest or abdomen to help maintain the baby's temperature and to help you bond with each other. Some mothers and babies start breastfeeding at this time. Your health care team will dry your baby and help keep your baby warm during this time.  Your baby may need immediate care if he or she: ? Showed signs of distress during labor. ? Has a medical condition. ? Was born too early (prematurely). ? Had a bowel movement before birth (meconium). ? Shows signs of difficulty transitioning from being inside the uterus to being outside of the uterus. If you are planning to breastfeed, your health care team will help you begin a feeding. Stage 3  The third stage of labor starts immediately after the birth of your baby and ends after you deliver the placenta. The placenta is an organ that develops  during pregnancy to provide oxygen and nutrients to your baby in the womb.  Delivering the placenta may require some pushing, and you may have mild contractions. Breastfeeding can stimulate contractions to help you deliver the placenta.  After the placenta is delivered, your uterus should tighten (contract) and become firm. This helps to stop bleeding in your uterus. To help your uterus contract and to control bleeding, your health care provider may: ? Give you medicine by injection, through an IV tube, by mouth, or through your rectum (rectally). ? Massage your abdomen or perform a vaginal exam to remove any blood clots that are left in your uterus. ? Empty your bladder by placing a thin, flexible tube (catheter) into your bladder. ? Encourage   Encourage you to breastfeed your baby. After labor is over, you and your baby will be monitored closely to ensure that you are both healthy until you are ready to go home. Your health care team will teach you how to care for yourself and your baby. This information is not intended to replace advice given to you by your health care provider. Make sure you discuss any questions you have with your health care provider. Document Released: 11/13/2007 Document Revised: 08/24/2015 Document Reviewed: 02/18/2015 Elsevier Interactive Patient Education  2018 [employer].  Places to have your son circumcised:    [facility] [phone] while you are in hospital  [facility] [phone] $244 by 4 wks  [facility] [phone] $175 by 2 wks  [facility] [phone] $250 by 7 days [facility] [phone] $269 by 4 wks  These prices sometimes change but are roughly what you can expect to pay. Please call and confirm pricing.   Circumcision is considered an elective/non-medically necessary procedure. There are many  reasons parents decide to have their sons circumsized. During the first year of life circumcised males have a reduced risk of urinary tract infections but after this year the rates between circumcised males and uncircumcised males are the same.  It is safe to have your son circumcised outside of the hospital and the places above perform them regularly.   Deciding about Circumcision in Baby Boys  (Up-to-date The Basics)  What is circumcision?  Circumcision is a surgery that removes the skin that covers the tip of the penis, called the "foreskin" Circumcision is usually done when a boy is between 75 and 13 days old. In the [place], circumcision is common. In some other countries, fewer boys are circumcised. Circumcision is a common tradition in some religions.  Should I have my baby boy circumcised?  There is no easy answer. Circumcision has some benefits. But it also has risks. After talking with your doctor, you will have to decide for yourself what is right for your family.  What are the benefits of circumcision?  Circumcised boys seem to have slightly lower rates of: ?Urinary tract infections ?Swelling of the opening at the tip of the penis Circumcised men seem to have slightly lower rates of: ?Urinary tract infections ?Swelling of the opening at the tip of the penis ?Penis cancer ?HIV and other infections that you catch during sex ?Cervical cancer in the women they have sex with Even so, in the [place], the risks of these problems are small - even in boys and men who have not been circumcised. Plus, boys and men who are not circumcised can reduce these extra risks by: ?Cleaning their penis well ?Using condoms during sex  What are the risks of circumcision?  Risks include: ?Bleeding or infection from the surgery ?Damage to or amputation of the penis ?A chance that the doctor will cut off too much or not enough of the foreskin ?A chance that sex won't feel as good  later in life Only about 1 out of every 200 circumcisions leads to problems. There is also a chance that your health insurance won't pay for circumcision.  How is circumcision done in baby boys?  First, the baby gets medicine for pain relief. This might be a cream on the skin or a shot into the base of the penis. Next, the doctor cleans the baby's penis well. Then he or she uses special tools to cut off the foreskin. Finally, the doctor wraps a bandage (called gauze) around  the baby's penis. If you have your baby circumcised, his doctor or nurse will give you instructions on how to care for him after the surgery. It is important that you follow those instructions carefully.

## 2017-06-08 NOTE — Progress Notes (Signed)
   PRENATAL VISIT NOTE  Subjective:  Meghan Ibarra is a 27 y.o. G1P0 at 476w3d being seen today for ongoing prenatal care.  She is currently monitored for the following issues for this low-risk pregnancy and has Supervision of normal first pregnancy, antepartum and Rh negative, antepartum on their problem list.  Patient reports no complaints.  Contractions: Irritability. Vag. Bleeding: None.  Movement: Present. Denies leaking of fluid.   The following portions of the patient's history were reviewed and updated as appropriate: allergies, current medications, past family history, past medical history, past social history, past surgical history and problem list. Problem list updated.  Objective:   Vitals:   06/08/17 0902  BP: 122/78  Pulse: 90  Weight: 204 lb 1.6 oz (92.6 kg)    Fetal Status: Fetal Heart Rate (bpm): 156 Fundal Height: 35 cm Movement: Present     General:  Alert, oriented and cooperative. Patient is in no acute distress.  Skin: Skin is warm and dry. No rash noted.   Cardiovascular: Normal heart rate noted  Respiratory: Normal respiratory effort, no problems with respiration noted  Abdomen: Soft, gravid, appropriate for gestational age.  Pain/Pressure: Present     Pelvic: Cervical exam deferred        Extremities: Normal range of motion.  Edema: Trace  Mental Status: Normal mood and affect. Normal behavior. Normal judgment and thought content.   Assessment and Plan:  Pregnancy: G1P0 at 5576w3d  1. Supervision of normal first pregnancy, antepartum - GBS at next visit   Preterm labor symptoms and general obstetric precautions including but not limited to vaginal bleeding, contractions, leaking of fluid and fetal movement were reviewed in detail with the patient. Please refer to After Visit Summary for other counseling recommendations.  Return in about 2 weeks (around 06/22/2017).  Future Appointments  Date Time Provider Department Center  06/16/2017  8:15 AM Marny LowensteinWenzel,  Julie N, PA-C [facility]    Thressa ShellerHeather Hogan, CNM

## 2017-06-16 ENCOUNTER — Encounter: Payer: BLUE CROSS/BLUE SHIELD | Admitting: Medical

## 2017-06-22 ENCOUNTER — Ambulatory Visit (INDEPENDENT_AMBULATORY_CARE_PROVIDER_SITE_OTHER): Payer: BLUE CROSS/BLUE SHIELD | Admitting: Advanced Practice Midwife

## 2017-06-22 ENCOUNTER — Encounter: Payer: Self-pay | Admitting: Advanced Practice Midwife

## 2017-06-22 VITALS — BP 123/79 | HR 86 | Wt 209.5 lb

## 2017-06-22 DIAGNOSIS — Z3403 Encounter for supervision of normal first pregnancy, third trimester: Secondary | ICD-10-CM

## 2017-06-22 DIAGNOSIS — Z3400 Encounter for supervision of normal first pregnancy, unspecified trimester: Secondary | ICD-10-CM

## 2017-06-22 DIAGNOSIS — Z113 Encounter for screening for infections with a predominantly sexual mode of transmission: Secondary | ICD-10-CM | POA: Diagnosis not present

## 2017-06-22 LAB — OB RESULTS CONSOLE GBS: STREP GROUP B AG: NEGATIVE

## 2017-06-22 LAB — OB RESULTS CONSOLE GC/CHLAMYDIA: Gonorrhea: NEGATIVE

## 2017-06-22 NOTE — Patient Instructions (Signed)
Vaginal delivery means that you will give birth by pushing your baby out of your birth canal (vagina). A team of health care providers will help you before, during, and after vaginal delivery. Birth experiences are unique for every woman and every pregnancy, and birth experiences vary depending on where you choose to give birth. What should I do to prepare for my baby's birth? Before your baby is born, it is important to talk with your health care provider about:  Your labor and delivery preferences. These may include: ? Medicines that you may be given. ? How you will manage your pain. This might include non-medical pain relief techniques or injectable pain relief such as epidural analgesia. ? How you and your baby will be monitored during labor and delivery. ? Who may be in the labor and delivery room with you. ? Your feelings about surgical delivery of your baby (cesarean delivery, or C-section) if this becomes necessary. ? Your feelings about receiving donated blood through an IV tube (blood transfusion) if this becomes necessary.  Whether you are able: ? To take pictures or videos of the birth. ? To eat during labor and delivery. ? To move around, walk, or change positions during labor and delivery.  What to expect after your baby is born, such as: ? Whether delayed umbilical cord clamping and cutting is offered. ? Who will care for your baby right after birth. ? Medicines or tests that may be recommended for your baby. ? Whether breastfeeding is supported in your hospital or birth center. ? How long you will be in the hospital or birth center.  How any medical conditions you have may affect your baby or your labor and delivery experience.  To prepare for your baby's birth, you should also:  Attend all of your health care visits before delivery (prenatal visits) as recommended by your health care provider. This is important.  Prepare your home for your baby's arrival. Make sure  that you have: ? Diapers. ? Baby clothing. ? Feeding equipment. ? Safe sleeping arrangements for you and your baby.  Install a car seat in your vehicle. Have your car seat checked by a certified car seat installer to make sure that it is installed safely.  Think about who will help you with your new baby at home for at least the first several weeks after delivery.  What can I expect when I arrive at the birth center or hospital? Once you are in labor and have been admitted into the hospital or birth center, your health care provider may:  Review your pregnancy history and any concerns you have.  Insert an IV tube into one of your veins. This is used to give you fluids and medicines.  Check your blood pressure, pulse, temperature, and heart rate (vital signs).  Check whether your bag of water (amniotic sac) has broken (ruptured).  Talk with you about your birth plan and discuss pain control options.  Monitoring Your health care provider may monitor your contractions (uterine monitoring) and your baby's heart rate (fetal monitoring). You may need to be monitored:  Often, but not continuously (intermittently).  All the time or for long periods at a time (continuously). Continuous monitoring may be needed if: ? You are taking certain medicines, such as medicine to relieve pain or make your contractions stronger. ? You have pregnancy or labor complications.  Monitoring may be done by:  Placing a special stethoscope or a handheld monitoring device on your abdomen to check your   baby's heartbeat, and feeling your abdomen for contractions. This method of monitoring does not continuously record your baby's heartbeat or your contractions.  Placing monitors on your abdomen (external monitors) to record your baby's heartbeat and the frequency and length of contractions. You may not have to wear external monitors all the time.  Placing monitors inside of your uterus (internal monitors) to  record your baby's heartbeat and the frequency, length, and strength of your contractions. ? Your health care provider may use internal monitors if he or she needs more information about the strength of your contractions or your baby's heart rate. ? Internal monitors are put in place by passing a thin, flexible wire through your vagina and into your uterus. Depending on the type of monitor, it may remain in your uterus or on your baby's head until birth. ? Your health care provider will discuss the benefits and risks of internal monitoring with you and will ask for your permission before inserting the monitors.  Telemetry. This is a type of continuous monitoring that can be done with external or internal monitors. Instead of having to stay in bed, you are able to move around during telemetry. Ask your health care provider if telemetry is an option for you.  Physical exam Your health care provider may perform a physical exam. This may include:  Checking whether your baby is positioned: ? With the head toward your vagina (head-down). This is most common. ? With the head toward the top of your uterus (head-up or breech). If your baby is in a breech position, your health care provider may try to turn your baby to a head-down position so you can deliver vaginally. If it does not seem that your baby can be born vaginally, your provider may recommend surgery to deliver your baby. In rare cases, you may be able to deliver vaginally if your baby is head-up (breech delivery). ? Lying sideways (transverse). Babies that are lying sideways cannot be delivered vaginally.  Checking your cervix to determine: ? Whether it is thinning out (effacing). ? Whether it is opening up (dilating). ? How low your baby has moved into your birth canal.  What are the three stages of labor and delivery?  Normal labor and delivery is divided into the following three stages: Stage 1  Stage 1 is the longest stage of labor,  and it can last for hours or days. Stage 1 includes: ? Early labor. This is when contractions may be irregular, or regular and mild. Generally, early labor contractions are more than 10 minutes apart. ? Active labor. This is when contractions get longer, more regular, more frequent, and more intense. ? The transition phase. This is when contractions happen very close together, are very intense, and may last longer than during any other part of labor.  Contractions generally feel mild, infrequent, and irregular at first. They get stronger, more frequent (about every 2-3 minutes), and more regular as you progress from early labor through active labor and transition.  Many women progress through stage 1 naturally, but you may need help to continue making progress. If this happens, your health care provider may talk with you about: ? Rupturing your amniotic sac if it has not ruptured yet. ? Giving you medicine to help make your contractions stronger and more frequent.  Stage 1 ends when your cervix is completely dilated to 4 inches (10 cm) and completely effaced. This happens at the end of the transition phase. Stage 2  Once your cervix   is completely effaced and dilated to 4 inches (10 cm), you may start to feel an urge to push. It is common for the body to naturally take a rest before feeling the urge to push, especially if you received an epidural or certain other pain medicines. This rest period may last for up to 1-2 hours, depending on your unique labor experience.  During stage 2, contractions are generally less painful, because pushing helps relieve contraction pain. Instead of contraction pain, you may feel stretching and burning pain, especially when the widest part of your baby's head passes through the vaginal opening (crowning).  Your health care provider will closely monitor your pushing progress and your baby's progress through the vagina during stage 2.  Your health care provider may  massage the area of skin between your vaginal opening and anus (perineum) or apply warm compresses to your perineum. This helps it stretch as the baby's head starts to crown, which can help prevent perineal tearing. ? In some cases, an incision may be made in your perineum (episiotomy) to allow the baby to pass through the vaginal opening. An episiotomy helps to make the opening of the vagina larger to allow more room for the baby to fit through.  It is very important to breathe and focus so your health care provider can control the delivery of your baby's head. Your health care provider may have you decrease the intensity of your pushing, to help prevent perineal tearing.  After delivery of your baby's head, the shoulders and the rest of the body generally deliver very quickly and without difficulty.  Once your baby is delivered, the umbilical cord may be cut right away, or this may be delayed for 1-2 minutes, depending on your baby's health. This may vary among health care providers, hospitals, and birth centers.  If you and your baby are healthy enough, your baby may be placed on your chest or abdomen to help maintain the baby's temperature and to help you bond with each other. Some mothers and babies start breastfeeding at this time. Your health care team will dry your baby and help keep your baby warm during this time.  Your baby may need immediate care if he or she: ? Showed signs of distress during labor. ? Has a medical condition. ? Was born too early (prematurely). ? Had a bowel movement before birth (meconium). ? Shows signs of difficulty transitioning from being inside the uterus to being outside of the uterus. If you are planning to breastfeed, your health care team will help you begin a feeding. Stage 3  The third stage of labor starts immediately after the birth of your baby and ends after you deliver the placenta. The placenta is an organ that develops during pregnancy to provide  oxygen and nutrients to your baby in the womb.  Delivering the placenta may require some pushing, and you may have mild contractions. Breastfeeding can stimulate contractions to help you deliver the placenta.  After the placenta is delivered, your uterus should tighten (contract) and become firm. This helps to stop bleeding in your uterus. To help your uterus contract and to control bleeding, your health care provider may: ? Give you medicine by injection, through an IV tube, by mouth, or through your rectum (rectally). ? Massage your abdomen or perform a vaginal exam to remove any blood clots that are left in your uterus. ? Empty your bladder by placing a thin, flexible tube (catheter) into your bladder. ? Encourage you to   breastfeed your baby. After labor is over, you and your baby will be monitored closely to ensure that you are both healthy until you are ready to go home. Your health care team will teach you how to care for yourself and your baby. This information is not intended to replace advice given to you by your health care provider. Make sure you discuss any questions you have with your health care provider. Document Released: 11/13/2007 Document Revised: 08/24/2015 Document Reviewed: 02/18/2015 Elsevier Interactive Patient Education  2018 [employer]  

## 2017-06-22 NOTE — Progress Notes (Signed)
   PRENATAL VISIT NOTE  Subjective:  Meghan Ibarra is a 28 y.o. G1P0 at [redacted]w[redacted]d being seen today for ongoing prenatal care.  She is currently monitored for the following issues for this low-risk pregnancy and has Supervision of normal first pregnancy, antepartum and Rh negative, antepartum on their problem list.  Patient reports no complaints.  Contractions: Not present. Vag. Bleeding: None.  Movement: Present. Denies leaking of fluid.   The following portions of the patient's history were reviewed and updated as appropriate: allergies, current medications, past family history, past medical history, past social history, past surgical history and problem list. Problem list updated.  Objective:   Vitals:   06/22/17 1012  BP: 123/79  Pulse: 86  Weight: 209 lb 8 oz (95 kg)    Fetal Status: Fetal Heart Rate (bpm): 144 Fundal Height: 36 cm Movement: Present  Presentation: Vertex  General:  Alert, oriented and cooperative. Patient is in no acute distress.  Skin: Skin is warm and dry. No rash noted.   Cardiovascular: Normal heart rate noted  Respiratory: Normal respiratory effort, no problems with respiration noted  Abdomen: Soft, gravid, appropriate for gestational age.  Pain/Pressure: Absent     Pelvic: Cervical exam performed Dilation: Fingertip Effacement (%): 20 Station: -2  Extremities: Normal range of motion.  Edema: Mild pitting, slight indentation  Mental Status: Normal mood and affect. Normal behavior. Normal judgment and thought content.   Assessment and Plan:  Pregnancy: G1P0 at 102w3d  1. Supervision of normal first pregnancy, antepartum - GC/Chlamydia probe amp ([place])not at [facility] - Culture, beta strep (group b only)  Term labor symptoms and general obstetric precautions including but not limited to vaginal bleeding, contractions, leaking of fluid and fetal movement were reviewed in detail with the patient. Please refer to After Visit Summary for other counseling  recommendations.  Return in about 1 week (around 06/29/2017).  No future appointments.  Thressa Sheller, CNM

## 2017-06-23 LAB — GC/CHLAMYDIA PROBE AMP (~~LOC~~) NOT AT ARMC
CHLAMYDIA, DNA PROBE: NEGATIVE
Neisseria Gonorrhea: NEGATIVE

## 2017-06-26 LAB — CULTURE, BETA STREP (GROUP B ONLY): Strep Gp B Culture: NEGATIVE

## 2017-07-01 ENCOUNTER — Ambulatory Visit (INDEPENDENT_AMBULATORY_CARE_PROVIDER_SITE_OTHER): Payer: BLUE CROSS/BLUE SHIELD

## 2017-07-01 VITALS — BP 120/81 | HR 70 | Wt 214.0 lb

## 2017-07-01 DIAGNOSIS — Z3400 Encounter for supervision of normal first pregnancy, unspecified trimester: Secondary | ICD-10-CM

## 2017-07-01 NOTE — Progress Notes (Signed)
   PRENATAL VISIT NOTE  Subjective:  Meghan Ibarra is a 27 y.o. G1P0 at [redacted]w[redacted]d being seen today for ongoing prenatal care.  She is currently monitored for the following issues for this low-risk pregnancy and has Supervision of normal first pregnancy, antepartum and Rh negative, antepartum on their problem list.  Patient reports no complaints.  Contractions: Irritability. Vag. Bleeding: None.  Movement: Present. Denies leaking of fluid.   The following portions of the patient's history were reviewed and updated as appropriate: allergies, current medications, past family history, past medical history, past social history, past surgical history and problem list. Problem list updated.  Objective:   Vitals:   07/01/17 1333  BP: 120/81  Pulse: 70  Weight: 214 lb (97.1 kg)    Fetal Status: Fetal Heart Rate (bpm): 153 Fundal Height: 38 cm Movement: Present     General:  Alert, oriented and cooperative. Patient is in no acute distress.  Skin: Skin is warm and dry. No rash noted.   Cardiovascular: Normal heart rate noted  Respiratory: Normal respiratory effort, no problems with respiration noted  Abdomen: Soft, gravid, appropriate for gestational age.  Pain/Pressure: Present     Pelvic: Cervical exam deferred        Extremities: Normal range of motion.  Edema: Mild pitting, slight indentation  Mental Status: Normal mood and affect. Normal behavior. Normal judgment and thought content.   Assessment and Plan:  Pregnancy: G1P0 at [redacted]w[redacted]d  1. Supervision of normal first pregnancy, antepartum -No complaints. Routine care  Term labor symptoms and general obstetric precautions including but not limited to vaginal bleeding, contractions, leaking of fluid and fetal movement were reviewed in detail with the patient. Please refer to After Visit Summary for other counseling recommendations.  Return in about 1 week (around 07/08/2017) for Return OB visit.  Rolm Bookbinder, CNM 07/01/17 1:43 PM

## 2017-07-01 NOTE — Patient Instructions (Signed)
Braxton Hicks Contractions °Contractions of the uterus can occur throughout pregnancy, but they are not always a sign that you are in labor. You may have practice contractions called Braxton Hicks contractions. These false labor contractions are sometimes confused with true labor. °What are Braxton Hicks contractions? °Braxton Hicks contractions are tightening movements that occur in the muscles of the uterus before labor. Unlike true labor contractions, these contractions do not result in opening (dilation) and thinning of the cervix. Toward the end of pregnancy (32-34 weeks), Braxton Hicks contractions can happen more often and may become stronger. These contractions are sometimes difficult to tell apart from true labor because they can be very uncomfortable. You should not feel embarrassed if you go to the hospital with false labor. °Sometimes, the only way to tell if you are in true labor is for your health care provider to look for changes in the cervix. The health care provider will do a physical exam and may monitor your contractions. If you are not in true labor, the exam should show that your cervix is not dilating and your water has not broken. °If there are other health problems associated with your pregnancy, it is completely safe for you to be sent home with false labor. You may continue to have Braxton Hicks contractions until you go into true labor. °How to tell the difference between true labor and false labor °True labor °· Contractions last 30-70 seconds. °· Contractions become very regular. °· Discomfort is usually felt in the top of the uterus, and it spreads to the lower abdomen and low back. °· Contractions do not go away with walking. °· Contractions usually become more intense and increase in frequency. °· The cervix dilates and gets thinner. °False labor °· Contractions are usually shorter and not as strong as true labor contractions. °· Contractions are usually irregular. °· Contractions  are often felt in the front of the lower abdomen and in the groin. °· Contractions may go away when you walk around or change positions while lying down. °· Contractions get weaker and are shorter-lasting as time goes on. °· The cervix usually does not dilate or become thin. °Follow these instructions at home: °· Take over-the-counter and prescription medicines only as told by your health care provider. °· Keep up with your usual exercises and follow other instructions from your health care provider. °· Eat and drink lightly if you think you are going into labor. °· If Braxton Hicks contractions are making you uncomfortable: °? Change your position from lying down or resting to walking, or change from walking to resting. °? Sit and rest in a tub of warm water. °? Drink enough fluid to keep your urine pale yellow. Dehydration may cause these contractions. °? Do slow and deep breathing several times an hour. °· Keep all follow-up prenatal visits as told by your health care provider. This is important. °Contact a health care provider if: °· You have a fever. °· You have continuous pain in your abdomen. °Get help right away if: °· Your contractions become stronger, more regular, and closer together. °· You have fluid leaking or gushing from your vagina. °· You pass blood-tinged mucus (bloody show). °· You have bleeding from your vagina. °· You have low back pain that you never had before. °· You feel your baby’s head pushing down and causing pelvic pressure. °· Your baby is not moving inside you as much as it used to. °Summary °· Contractions that occur before labor are called Braxton   Hicks contractions, false labor, or practice contractions. °· Braxton Hicks contractions are usually shorter, weaker, farther apart, and less regular than true labor contractions. True labor contractions usually become progressively stronger and regular and they become more frequent. °· Manage discomfort from Braxton Hicks contractions by  changing position, resting in a warm bath, drinking plenty of water, or practicing deep breathing. °This information is not intended to replace advice given to you by your health care provider. Make sure you discuss any questions you have with your health care provider. °Document Released: 06/19/2016 Document Revised: 06/19/2016 Document Reviewed: 06/19/2016 °Elsevier Interactive Patient Education © 2018 [employer] Inc. ° °Safe Medications in Pregnancy  ° °Acne: °Benzoyl Peroxide °Salicylic Acid ° °Backache/Headache: °Tylenol: 2 regular strength every 4 hours OR °             2 Extra strength every 6 hours ° °Colds/Coughs/Allergies: °Benadryl (alcohol free) 25 mg every 6 hours as needed °Breath right strips °Claritin °Cepacol throat lozenges °Chloraseptic throat spray °Cold-Eeze- up to three times per day °Cough drops, alcohol free °Flonase (by prescription only) °Guaifenesin °Mucinex °Robitussin DM (plain only, alcohol free) °Saline nasal spray/drops °Sudafed (pseudoephedrine) & Actifed ** use only after [redacted] weeks gestation and if you do not have high blood pressure °Tylenol °Vicks Vaporub °Zinc lozenges °Zyrtec  ° °Constipation: °Colace °Ducolax suppositories °Fleet enema °Glycerin suppositories °Metamucil °Milk of magnesia °Miralax °Senokot °Smooth move tea ° °Diarrhea: °Kaopectate °Imodium A-D ° °*NO pepto Bismol ° °Hemorrhoids: °Anusol °Anusol HC °Preparation H °Tucks ° °Indigestion: °Tums °Maalox °Mylanta °Zantac  °Pepcid ° °Insomnia: °Benadryl (alcohol free) 25mg every 6 hours as needed °Tylenol PM °Unisom, no Gelcaps ° °Leg Cramps: °Tums °MagGel ° °Nausea/Vomiting:  °Bonine °Dramamine °Emetrol °Ginger extract °Sea bands °Meclizine  °Nausea medication to take during pregnancy:  °Unisom (doxylamine succinate 25 mg tablets) Take one tablet daily at bedtime. If symptoms are not adequately controlled, the dose can be increased to a maximum recommended dose of two tablets daily (1/2 tablet in the morning, 1/2 tablet  mid-afternoon and one at bedtime). °Vitamin B6 100mg tablets. Take one tablet twice a day (up to 200 mg per day). ° °Skin Rashes: °Aveeno products °Benadryl cream or 25mg every 6 hours as needed °Calamine Lotion °1% cortisone cream ° °Yeast infection: °Gyne-lotrimin 7 °Monistat 7 ° ° °**If taking multiple medications, please check labels to avoid duplicating the same active ingredients °**take medication as directed on the label °** Do not exceed 4000 mg of tylenol in 24 hours °**Do not take medications that contain aspirin or ibuprofen ° ° ° ° °

## 2017-07-08 ENCOUNTER — Ambulatory Visit (INDEPENDENT_AMBULATORY_CARE_PROVIDER_SITE_OTHER): Payer: BLUE CROSS/BLUE SHIELD | Admitting: Advanced Practice Midwife

## 2017-07-08 VITALS — BP 126/83 | HR 81 | Wt 211.8 lb

## 2017-07-08 DIAGNOSIS — Z3403 Encounter for supervision of normal first pregnancy, third trimester: Secondary | ICD-10-CM

## 2017-07-08 DIAGNOSIS — Z3400 Encounter for supervision of normal first pregnancy, unspecified trimester: Secondary | ICD-10-CM

## 2017-07-08 NOTE — Patient Instructions (Addendum)

## 2017-07-08 NOTE — Progress Notes (Signed)
   PRENATAL VISIT NOTE  Subjective:  Meghan Ibarra is a 27 y.o. G1P0 at [redacted]w[redacted]d being seen today for ongoing prenatal care.  She is currently monitored for the following issues for this low-risk pregnancy and has Supervision of normal first pregnancy, antepartum and Rh negative, antepartum on their problem list.  Patient reports no complaints.  Contractions: Not present. Vag. Bleeding: None.  Movement: Present. Denies leaking of fluid.   The following portions of the patient's history were reviewed and updated as appropriate: allergies, current medications, past family history, past medical history, past social history, past surgical history and problem list. Problem list updated.  Objective:   Vitals:   07/08/17 0902  BP: 126/83  Pulse: 81  Weight: 211 lb 12.8 oz (96.1 kg)    Fetal Status: Fetal Heart Rate (bpm): 162   Movement: Present     General:  Alert, oriented and cooperative. Patient is in no acute distress.  Skin: Skin is warm and dry. No rash noted.   Cardiovascular: Normal heart rate noted  Respiratory: Normal respiratory effort, no problems with respiration noted  Abdomen: Soft, gravid, appropriate for gestational age.  Pain/Pressure: Present     Pelvic: Cervical exam deferred        Extremities: Normal range of motion.  Edema: Trace  Mental Status: Normal mood and affect. Normal behavior. Normal judgment and thought content.   Assessment and Plan:  Pregnancy: G1P0 at [redacted]w[redacted]d  1. Supervision of normal first pregnancy, antepartum --Anticipatory guidance about next weeks of pregnancy, next prenatal visits.  Term labor symptoms and general obstetric precautions including but not limited to vaginal bleeding, contractions, leaking of fluid and fetal movement were reviewed in detail with the patient. Please refer to After Visit Summary for other counseling recommendations.  Return in about 2 weeks (around 07/22/2017).  No future appointments.  Sharen Counter, CNM

## 2017-07-17 ENCOUNTER — Ambulatory Visit (INDEPENDENT_AMBULATORY_CARE_PROVIDER_SITE_OTHER): Payer: BLUE CROSS/BLUE SHIELD | Admitting: Obstetrics and Gynecology

## 2017-07-17 VITALS — BP 130/89 | HR 78 | Wt 215.6 lb

## 2017-07-17 DIAGNOSIS — Z3400 Encounter for supervision of normal first pregnancy, unspecified trimester: Secondary | ICD-10-CM

## 2017-07-17 NOTE — Progress Notes (Signed)
   PRENATAL VISIT NOTE  Subjective:  Meghan Ibarra is a 27 y.o. G1P0 at 1835w0d being seen today for ongoing prenatal care.  She is currently monitored for the following issues for this low-risk pregnancy and has Supervision of normal first pregnancy, antepartum and Rh negative, antepartum on their problem list.  Patient reports occasional contractions.  Contractions: Irregular. Vag. Bleeding: None.  Movement: Present. Denies leaking of fluid.   The following portions of the patient's history were reviewed and updated as appropriate: allergies, current medications, past family history, past medical history, past social history, past surgical history and problem list. Problem list updated.  Objective:   Vitals:   07/17/17 0941  BP: 130/89  Pulse: 78  Weight: 215 lb 9.6 oz (97.8 kg)    Fetal Status: Fetal Heart Rate (bpm): 143 Fundal Height: 41 cm Movement: Present  Presentation: Vertex  General:  Alert, oriented and cooperative. Patient is in no acute distress.  Skin: Skin is warm and dry. No rash noted.   Cardiovascular: Normal heart rate noted  Respiratory: Normal respiratory effort, no problems with respiration noted  Abdomen: Soft, gravid, appropriate for gestational age.  Pain/Pressure: Present     Pelvic: Cervical exam performed Dilation: 1 Effacement (%): 70 Station: -1, 0  Extremities: Normal range of motion.  Edema: None  Mental Status: Normal mood and affect. Normal behavior. Normal judgment and thought content.   Assessment and Plan:  Pregnancy: G1P0 at 2535w0d  Supervision of normal first pregnancy, antepartum - Labor precautions reviewed - IOL scheduled for 41 wks on 07/24/2017 @ 0630 (IOL orders entered in Epic) - Discussed possible plan for IOL - cytotec, FB and pitcon  Term labor symptoms and general obstetric precautions including but not limited to vaginal bleeding, contractions, leaking of fluid and fetal movement were reviewed in detail with the patient. Please  refer to After Visit Summary for other counseling recommendations.  Return in about 1 week (around 07/24/2017).  Future Appointments  Date Time Provider Department Center  07/24/2017  6:30 AM [facility] None    Raelyn Moraolitta Shawnell Dykes, CNM

## 2017-07-17 NOTE — Progress Notes (Deleted)
   PRENATAL VISIT NOTE  Subjective:  Meghan Ibarra is a 27 y.o. G1P0 at 5022w0d being seen today for ongoing prenatal care.  She is currently monitored for the following issues for this {Blank single:19197::"high-risk","low-risk"} pregnancy and has Supervision of normal first pregnancy, antepartum and Rh negative, antepartum on their problem list.  Patient reports {sx:14538}.  Contractions: Irregular. Vag. Bleeding: None.  Movement: Present. Denies leaking of fluid.   The following portions of the patient's history were reviewed and updated as appropriate: allergies, current medications, past family history, past medical history, past social history, past surgical history and problem list. Problem list updated.  Objective:   Vitals:   07/17/17 0941  BP: 130/89  Pulse: 78  Weight: 215 lb 9.6 oz (97.8 kg)    Fetal Status: Fetal Heart Rate (bpm): 143   Movement: Present     General:  Alert, oriented and cooperative. Patient is in no acute distress.  Skin: Skin is warm and dry. No rash noted.   Cardiovascular: Normal heart rate noted  Respiratory: Normal respiratory effort, no problems with respiration noted  Abdomen: Soft, gravid, appropriate for gestational age.  Pain/Pressure: Present     Pelvic: {Blank single:19197::"Cervical exam performed","Cervical exam deferred"}        Extremities: Normal range of motion.  Edema: None  Mental Status: Normal mood and affect. Normal behavior. Normal judgment and thought content.   Assessment and Plan:  Pregnancy: G1P0 at 5622w0d  There are no diagnoses linked to this encounter. {Blank single:19197::"Term","Preterm"} labor symptoms and general obstetric precautions including but not limited to vaginal bleeding, contractions, leaking of fluid and fetal movement were reviewed in detail with the patient. Please refer to After Visit Summary for other counseling recommendations.  No follow-ups on file.  No future appointments.  Raelyn Moraolitta Shantia Sanford,  CNM

## 2017-07-17 NOTE — Patient Instructions (Signed)
Braxton Hicks Contractions °Contractions of the uterus can occur throughout pregnancy, but they are not always a sign that you are in labor. You may have practice contractions called Braxton Hicks contractions. These false labor contractions are sometimes confused with true labor. °What are Braxton Hicks contractions? °Braxton Hicks contractions are tightening movements that occur in the muscles of the uterus before labor. Unlike true labor contractions, these contractions do not result in opening (dilation) and thinning of the cervix. Toward the end of pregnancy (32-34 weeks), Braxton Hicks contractions can happen more often and may become stronger. These contractions are sometimes difficult to tell apart from true labor because they can be very uncomfortable. You should not feel embarrassed if you go to the hospital with false labor. °Sometimes, the only way to tell if you are in true labor is for your health care provider to look for changes in the cervix. The health care provider will do a physical exam and may monitor your contractions. If you are not in true labor, the exam should show that your cervix is not dilating and your water has not broken. °If there are other health problems associated with your pregnancy, it is completely safe for you to be sent home with false labor. You may continue to have Braxton Hicks contractions until you go into true labor. °How to tell the difference between true labor and false labor °True labor °· Contractions last 30-70 seconds. °· Contractions become very regular. °· Discomfort is usually felt in the top of the uterus, and it spreads to the lower abdomen and low back. °· Contractions do not go away with walking. °· Contractions usually become more intense and increase in frequency. °· The cervix dilates and gets thinner. °False labor °· Contractions are usually shorter and not as strong as true labor contractions. °· Contractions are usually irregular. °· Contractions  are often felt in the front of the lower abdomen and in the groin. °· Contractions may go away when you walk around or change positions while lying down. °· Contractions get weaker and are shorter-lasting as time goes on. °· The cervix usually does not dilate or become thin. °Follow these instructions at home: °· Take over-the-counter and prescription medicines only as told by your health care provider. °· Keep up with your usual exercises and follow other instructions from your health care provider. °· Eat and drink lightly if you think you are going into labor. °· If Braxton Hicks contractions are making you uncomfortable: °? Change your position from lying down or resting to walking, or change from walking to resting. °? Sit and rest in a tub of warm water. °? Drink enough fluid to keep your urine pale yellow. Dehydration may cause these contractions. °? Do slow and deep breathing several times an hour. °· Keep all follow-up prenatal visits as told by your health care provider. This is important. °Contact a health care provider if: °· You have a fever. °· You have continuous pain in your abdomen. °Get help right away if: °· Your contractions become stronger, more regular, and closer together. °· You have fluid leaking or gushing from your vagina. °· You pass blood-tinged mucus (bloody show). °· You have bleeding from your vagina. °· You have low back pain that you never had before. °· You feel your baby’s head pushing down and causing pelvic pressure. °· Your baby is not moving inside you as much as it used to. °Summary °· Contractions that occur before labor are called Braxton   Hicks contractions, false labor, or practice contractions. °· Braxton Hicks contractions are usually shorter, weaker, farther apart, and less regular than true labor contractions. True labor contractions usually become progressively stronger and regular and they become more frequent. °· Manage discomfort from Braxton Hicks contractions by  changing position, resting in a warm bath, drinking plenty of water, or practicing deep breathing. °This information is not intended to replace advice given to you by your health care provider. Make sure you discuss any questions you have with your health care provider. °Document Released: 06/19/2016 Document Revised: 06/19/2016 Document Reviewed: 06/19/2016 °Elsevier Interactive Patient Education © 2018 [employer] ° °

## 2017-07-20 ENCOUNTER — Encounter (HOSPITAL_COMMUNITY): Payer: Self-pay | Admitting: Anesthesiology

## 2017-07-20 ENCOUNTER — Other Ambulatory Visit: Payer: Self-pay

## 2017-07-20 ENCOUNTER — Inpatient Hospital Stay (HOSPITAL_COMMUNITY)
Admission: AD | Admit: 2017-07-20 | Discharge: 2017-07-22 | DRG: 768 | Disposition: A | Discharge status: Home / Self Care | Payer: BLUE CROSS/BLUE SHIELD | Attending: Obstetrics and Gynecology | Admitting: Obstetrics and Gynecology

## 2017-07-20 ENCOUNTER — Encounter (HOSPITAL_COMMUNITY): Payer: Self-pay

## 2017-07-20 DIAGNOSIS — O480 Post-term pregnancy: Secondary | ICD-10-CM | POA: Diagnosis present

## 2017-07-20 DIAGNOSIS — Z3A41 41 weeks gestation of pregnancy: Secondary | ICD-10-CM | POA: Diagnosis not present

## 2017-07-20 DIAGNOSIS — Z6791 Unspecified blood type, Rh negative: Secondary | ICD-10-CM

## 2017-07-20 DIAGNOSIS — O134 Gestational [pregnancy-induced] hypertension without significant proteinuria, complicating childbirth: Principal | ICD-10-CM | POA: Diagnosis present

## 2017-07-20 DIAGNOSIS — Z3A40 40 weeks gestation of pregnancy: Secondary | ICD-10-CM | POA: Diagnosis not present

## 2017-07-20 DIAGNOSIS — O26893 Other specified pregnancy related conditions, third trimester: Secondary | ICD-10-CM | POA: Diagnosis present

## 2017-07-20 DIAGNOSIS — O139 Gestational [pregnancy-induced] hypertension without significant proteinuria, unspecified trimester: Secondary | ICD-10-CM

## 2017-07-20 LAB — CBC
HCT: 37.7 % (ref 36.0–46.0)
HEMOGLOBIN: 11.8 g/dL — AB (ref 12.0–15.0)
MCH: 28.6 pg (ref 26.0–34.0)
MCHC: 31.3 g/dL (ref 30.0–36.0)
MCV: 91.5 fL (ref 78.0–100.0)
Platelets: 235 10*3/uL (ref 150–400)
RBC: 4.12 MIL/uL (ref 3.87–5.11)
RDW: 16 % — ABNORMAL HIGH (ref 11.5–15.5)
WBC: 11.5 10*3/uL — AB (ref 4.0–10.5)

## 2017-07-20 LAB — COMPREHENSIVE METABOLIC PANEL
ALT: 13 U/L — ABNORMAL LOW (ref 14–54)
AST: 23 U/L (ref 15–41)
Albumin: 2.8 g/dL — ABNORMAL LOW (ref 3.5–5.0)
Alkaline Phosphatase: 205 U/L — ABNORMAL HIGH (ref 38–126)
Anion gap: 11 (ref 5–15)
BILIRUBIN TOTAL: 0.6 mg/dL (ref 0.3–1.2)
BUN: 10 mg/dL (ref 6–20)
CO2: 18 mmol/L — ABNORMAL LOW (ref 22–32)
Calcium: 8.9 mg/dL (ref 8.9–10.3)
Chloride: 106 mmol/L (ref 101–111)
Creatinine, Ser: 0.69 mg/dL (ref 0.44–1.00)
GFR calc Af Amer: >60 mL/min (ref >60)
Glucose, Bld: 82 mg/dL (ref 65–99)
POTASSIUM: 4.2 mmol/L (ref 3.5–5.1)
Sodium: 135 mmol/L (ref 135–145)
TOTAL PROTEIN: 6.8 g/dL (ref 6.5–8.1)

## 2017-07-20 LAB — RPR: RPR: NONREACTIVE

## 2017-07-20 LAB — TYPE AND SCREEN
ABO/RH(D): B NEG
Antibody Screen: NEGATIVE

## 2017-07-20 LAB — ABO/RH: ABO/RH(D): B NEG

## 2017-07-20 LAB — PROTEIN / CREATININE RATIO, URINE
CREATININE, URINE: 115 mg/dL
Protein Creatinine Ratio: 0.06 mg/mg{Cre} (ref 0.00–0.15)
Total Protein, Urine: 7 mg/dL

## 2017-07-20 MED ORDER — OXYTOCIN BOLUS FROM INFUSION
500.0000 mL | Freq: Once | INTRAVENOUS | Status: AC
  Administered 2017-07-20: 500 mL via INTRAVENOUS

## 2017-07-20 MED ORDER — POLYETHYLENE GLYCOL 3350 17 G PO PACK
17.0000 g | PACK | Freq: Every day | ORAL | Status: DC
  Administered 2017-07-21: 17 g via ORAL
  Filled 2017-07-20 (×4): qty 1

## 2017-07-20 MED ORDER — OXYCODONE-ACETAMINOPHEN 5-325 MG PO TABS: 2.0000 | ORAL_TABLET | ORAL | Status: DC | PRN

## 2017-07-20 MED ORDER — ACETAMINOPHEN 325 MG PO TABS
650.0000 mg | ORAL_TABLET | ORAL | Status: DC | PRN
Start: 2017-07-20 — End: 2017-07-22
  Administered 2017-07-20 – 2017-07-21 (×2): 650 mg via ORAL
  Filled 2017-07-20 (×2): qty 2

## 2017-07-20 MED ORDER — PRENATAL MULTIVITAMIN CH
1.0000 | ORAL_TABLET | Freq: Every day | ORAL | Status: DC
  Administered 2017-07-21 – 2017-07-22 (×2): 1 via ORAL
  Filled 2017-07-20 (×2): qty 1

## 2017-07-20 MED ORDER — DIPHENHYDRAMINE HCL 50 MG/ML IJ SOLN: 12.5000 mg | INTRAMUSCULAR | Status: DC | PRN

## 2017-07-20 MED ORDER — WITCH HAZEL-GLYCERIN EX PADS: 1.0000 "application " | MEDICATED_PAD | CUTANEOUS | Status: DC | PRN

## 2017-07-20 MED ORDER — FENTANYL CITRATE (PF) 100 MCG/2ML IJ SOLN
100.0000 ug | INTRAMUSCULAR | Status: DC | PRN
  Administered 2017-07-20 (×2): 100 ug via INTRAVENOUS
  Filled 2017-07-20 (×2): qty 2

## 2017-07-20 MED ORDER — OXYTOCIN 40 UNITS IN LACTATED RINGERS INFUSION - SIMPLE MED
1.0000 m[IU]/min | INTRAVENOUS | Status: DC
  Administered 2017-07-20: 2 m[IU]/min via INTRAVENOUS

## 2017-07-20 MED ORDER — OXYCODONE-ACETAMINOPHEN 5-325 MG PO TABS: 1.0000 | ORAL_TABLET | ORAL | Status: DC | PRN

## 2017-07-20 MED ORDER — LACTATED RINGERS IV SOLN: 500.0000 mL | INTRAVENOUS | Status: DC | PRN

## 2017-07-20 MED ORDER — ZOLPIDEM TARTRATE 5 MG PO TABS: 5.0000 mg | ORAL_TABLET | Freq: Every evening | ORAL | Status: DC | PRN

## 2017-07-20 MED ORDER — IBUPROFEN 600 MG PO TABS
600.0000 mg | ORAL_TABLET | Freq: Four times a day (QID) | ORAL | Status: DC
  Administered 2017-07-20 – 2017-07-22 (×8): 600 mg via ORAL
  Filled 2017-07-20 (×8): qty 1

## 2017-07-20 MED ORDER — OXYCODONE HCL 5 MG PO TABS: 10.0000 mg | ORAL_TABLET | ORAL | Status: DC | PRN

## 2017-07-20 MED ORDER — ACETAMINOPHEN 325 MG PO TABS: 650.0000 mg | ORAL_TABLET | ORAL | Status: DC | PRN

## 2017-07-20 MED ORDER — SOD CITRATE-CITRIC ACID 500-334 MG/5ML PO SOLN: 30.0000 mL | ORAL | Status: DC | PRN

## 2017-07-20 MED ORDER — COCONUT OIL OIL
1.0000 "application " | TOPICAL_OIL | Status: DC | PRN
  Administered 2017-07-21: 1 via TOPICAL
  Filled 2017-07-20: qty 120

## 2017-07-20 MED ORDER — SIMETHICONE 80 MG PO CHEW: 80.0000 mg | CHEWABLE_TABLET | ORAL | Status: DC | PRN

## 2017-07-20 MED ORDER — TETANUS-DIPHTH-ACELL PERTUSSIS 5-2.5-18.5 LF-MCG/0.5 IM SUSP: 0.5000 mL | Freq: Once | INTRAMUSCULAR | Status: DC

## 2017-07-20 MED ORDER — PHENYLEPHRINE 40 MCG/ML (10ML) SYRINGE FOR IV PUSH (FOR BLOOD PRESSURE SUPPORT)
80.0000 ug | PREFILLED_SYRINGE | INTRAVENOUS | Status: DC | PRN
  Filled 2017-07-20: qty 10
  Filled 2017-07-20: qty 5

## 2017-07-20 MED ORDER — TERBUTALINE SULFATE 1 MG/ML IJ SOLN
0.2500 mg | Freq: Once | INTRAMUSCULAR | Status: DC | PRN
  Filled 2017-07-20: qty 1

## 2017-07-20 MED ORDER — BENZOCAINE-MENTHOL 20-0.5 % EX AERO
1.0000 "application " | INHALATION_SPRAY | CUTANEOUS | Status: DC | PRN
  Administered 2017-07-20: 1 via TOPICAL
  Filled 2017-07-20: qty 56

## 2017-07-20 MED ORDER — ONDANSETRON HCL 4 MG PO TABS: 4.0000 mg | ORAL_TABLET | ORAL | Status: DC | PRN

## 2017-07-20 MED ORDER — DIPHENHYDRAMINE HCL 25 MG PO CAPS: 25.0000 mg | ORAL_CAPSULE | Freq: Four times a day (QID) | ORAL | Status: DC | PRN

## 2017-07-20 MED ORDER — SENNOSIDES-DOCUSATE SODIUM 8.6-50 MG PO TABS
2.0000 | ORAL_TABLET | ORAL | Status: DC
  Administered 2017-07-20 – 2017-07-22 (×2): 2 via ORAL
  Filled 2017-07-20 (×2): qty 2

## 2017-07-20 MED ORDER — DIBUCAINE 1 % RE OINT: 1.0000 "application " | TOPICAL_OINTMENT | RECTAL | Status: DC | PRN

## 2017-07-20 MED ORDER — PHENYLEPHRINE 40 MCG/ML (10ML) SYRINGE FOR IV PUSH (FOR BLOOD PRESSURE SUPPORT)
80.0000 ug | PREFILLED_SYRINGE | INTRAVENOUS | Status: DC | PRN
  Filled 2017-07-20: qty 5

## 2017-07-20 MED ORDER — LIDOCAINE HCL (PF) 1 % IJ SOLN
30.0000 mL | INTRAMUSCULAR | Status: AC | PRN
  Administered 2017-07-20: 30 mL via SUBCUTANEOUS
  Filled 2017-07-20: qty 30

## 2017-07-20 MED ORDER — OXYCODONE HCL 5 MG PO TABS: 5.0000 mg | ORAL_TABLET | ORAL | Status: DC | PRN

## 2017-07-20 MED ORDER — EPHEDRINE 5 MG/ML INJ
10.0000 mg | INTRAVENOUS | Status: DC | PRN
  Filled 2017-07-20: qty 2

## 2017-07-20 MED ORDER — ONDANSETRON HCL 4 MG/2ML IJ SOLN: 4.0000 mg | INTRAMUSCULAR | Status: DC | PRN

## 2017-07-20 MED ORDER — LACTATED RINGERS IV SOLN
INTRAVENOUS | Status: DC
  Administered 2017-07-20: 02:00:00 via INTRAVENOUS

## 2017-07-20 MED ORDER — DOCUSATE SODIUM 100 MG PO CAPS
100.0000 mg | ORAL_CAPSULE | Freq: Two times a day (BID) | ORAL | Status: DC
  Administered 2017-07-20 – 2017-07-21 (×3): 100 mg via ORAL
  Filled 2017-07-20 (×4): qty 1

## 2017-07-20 MED ORDER — OXYTOCIN 40 UNITS IN LACTATED RINGERS INFUSION - SIMPLE MED
2.5000 [IU]/h | INTRAVENOUS | Status: DC
  Filled 2017-07-20: qty 1000

## 2017-07-20 MED ORDER — EPHEDRINE 5 MG/ML INJ
10.0000 mg | INTRAVENOUS | Status: DC | PRN
  Filled 2017-07-20: qty 4
  Filled 2017-07-20: qty 2
  Filled 2017-07-20: qty 4

## 2017-07-20 MED ORDER — OXYTOCIN 10 UNIT/ML IJ SOLN
10.0000 [IU] | Freq: Once | INTRAMUSCULAR | Status: DC | PRN
  Filled 2017-07-20: qty 1

## 2017-07-20 MED ORDER — FENTANYL 2.5 MCG/ML BUPIVACAINE 1/10 % EPIDURAL INFUSION (WH - ANES)
14.0000 mL/h | INTRAMUSCULAR | Status: DC | PRN
  Administered 2017-07-20: 14 mL/h via EPIDURAL
  Filled 2017-07-20: qty 100

## 2017-07-20 MED ORDER — LACTATED RINGERS IV SOLN
500.0000 mL | Freq: Once | INTRAVENOUS | Status: AC
  Administered 2017-07-20: 08:00:00 via INTRAVENOUS

## 2017-07-20 NOTE — H&P (Addendum)
LABOR ADMISSION HISTORY AND PHYSICAL  Meghan PortelaBrittany Ibarra is a 27 y.o. female G1P0 with IUP at 1745w3d by LMP presenting for latent labor with new onset of gHTN. She reports +FMs, No LOF, no VB, no blurry vision, headaches or peripheral edema, and RUQ pain.  She plans on breast feeding. She request nexplanon for birth control.  Dating: By LMP  --->  Estimated Date of Delivery: 07/17/17  [facility] Active Problem List   Diagnosis Date Noted  . Post term pregnancy at [redacted] weeks gestation 07/20/2017  . Rh negative, antepartum 01/16/2017  . Supervision of normal first pregnancy, antepartum 01/07/2017   Past Medical History: Past Medical History:  Diagnosis Date  . Medical history non-contributory     Past Surgical History: Past Surgical History:  Procedure Laterality Date  . NO PAST SURGERIES      Obstetrical History: OB History    Gravida  1   Para      Term      Preterm      AB      Living        SAB      TAB      Ectopic      Multiple      Live Births              Social History: Social History   Socioeconomic History  . Marital status: Married    Spouse name: Not on file  . Number of children: Not on file  . Years of education: Not on file  . Highest education level: Not on file  Occupational History  . Not on file  Social Needs  . Financial resource strain: Not on file  . Food insecurity:    Worry: Not on file    Inability: Not on file  . Transportation needs:    Medical: Not on file    Non-medical: Not on file  Tobacco Use  . Smoking status: Never Smoker  . Smokeless tobacco: Never Used  Substance and Sexual Activity  . Alcohol use: No    Frequency: Never  . Drug use: No  . Sexual activity: Yes    Birth control/protection: None  Lifestyle  . Physical activity:    Days per week: Not on file    Minutes per session: Not on file  . Stress: Not on file  Relationships  . Social connections:   Talks on phone: Not on file    Gets together: Not on file    Attends religious service: Not on file    Active member of club or organization: Not on file    Attends meetings of clubs or organizations: Not on file    Relationship status: Not on file  Other Topics Concern  . Not on file  Social History Narrative  . Not on file    Family History: Family History  Problem Relation Age of Onset  . Diabetes Father   . Hypertension Father     Allergies: No Known Allergies  Medications Prior to Admission  Medication Sig Dispense Refill Last Dose  . aspirin EC 81 MG tablet Take 1 tablet (81 mg total) by mouth daily. 30 tablet 5 07/19/2017 at Unknown time  . Prenatal Vit-Fe Fumarate-FA (PRENATAL MULTIVITAMIN) TABS tablet Take 1 tablet by mouth daily at 12 noon.   07/19/2017 at Unknown time    Review of Systems   All systems reviewed and negative except as stated in HPI   Physical  Exam:  Blood pressure 121/85, pulse 85, temperature 98.5 F (36.9 C), temperature source Oral, resp. rate 18, height 5\' 7"  (1.702 m), weight 216 lb (98 kg), last menstrual period 10/10/2016, SpO2 99 %.  General appearance: alert and no distress HEENT: no JVD, MMM Abdomen: soft, non-tender; bowel sounds normal Extremities: Homans sign is negative, no sign of DVT Presentation: cephalic Fetal monitoringBaseline: 140 bpm, Variability: Good {> 6 bpm), Accelerations: Reactive and Decelerations: Absent Uterine activityFrequency: Every 4-5 minutes Dilation: 2 Effacement (%): 90 Station: -1 Exam by:: Camelia Eng RN   Prenatal labs: ABO, Rh: B/Negative/-- (11/21 1617) Antibody: Negative (11/21 1617) Rubella: 3.71 (11/21 1617) RPR: Non Reactive (03/12 0824)  HBsAg: Negative (11/21 1617)  HIV: Non Reactive (03/12 0824)  GBS: Negative (05/06 0000)  2 hr Glucola wnl  Prenatal Transfer Tool  Maternal Diabetes: No Genetic Screening: Normal Maternal Ultrasounds/Referrals: Normal Fetal Ultrasounds or  other Referrals:  None Maternal Substance Abuse:  No Significant Maternal Medications:  None Significant Maternal Lab Results: None  Results for orders placed or performed during the hospital encounter of 07/20/17 (from the past 24 hour(s))  CBC   Collection Time: 07/20/17  2:00 AM  Result Value Ref Range   WBC 11.5 (H) 4.0 - 10.5 K/uL   RBC 4.12 3.87 - 5.11 MIL/uL   Hemoglobin 11.8 (L) 12.0 - 15.0 g/dL   HCT 95.6 21.3 - 08.6 %   MCV 91.5 78.0 - 100.0 fL   MCH 28.6 26.0 - 34.0 pg   MCHC 31.3 30.0 - 36.0 g/dL   RDW 57.8 (H) 46.9 - 62.9 %   Platelets 235 150 - 400 K/uL    Patient Active Problem List   Diagnosis Date Noted  . Post term pregnancy at [redacted] weeks gestation 07/20/2017  . Rh negative, antepartum 01/16/2017  . Supervision of normal first pregnancy, antepartum 01/07/2017    Assessment: Meghan Ibarra is a 27 y.o. G1P0 at [redacted]w[redacted]d here for latent labor with new onset gHTN  #Labor: latent phase, will monitor, will augement as needed  #GHTN: new onset, non-severe range, monitor, obtain CMP, CBC, PCR, low threshold to start antihypertensives #Pain: Epidural #FWB: Cat I #ID:  GBS neg #MOF: Breast #MOC: Nexplanon #Circ:  Inpatient  Thomes Dinning, MD, MS FAMILY MEDICINE RESIDENT - PGY1 07/20/2017 2:59 AM  OB FELLOW HISTORY AND PHYSICAL ATTESTATION  I confirm that I have verified the information documented in the resident's note and that I have also personally reperformed the physical exam and all medical decision making activities. I agree with above documentation and have made edits as needed.   Caryl Ada [facility] Fellow 07/20/2017

## 2017-07-20 NOTE — Progress Notes (Signed)
Starting to push

## 2017-07-20 NOTE — Lactation Note (Signed)
This note was copied from a baby's chart. Lactation Consultation Note  Patient Name: Meghan Ibarra Today's Date: 07/20/2017 Reason for consult: Initial assessment;Primapara;1st time breastfeeding;Term  Visited with P1 Mom of term infant at 675 hrs old.  Mom trying to breastfeed baby, and baby sucking on nipple only in football hold.   Repositioned Mom with pillow behind her back, and stacked pillows at her side for baby to be at breast height.   Hand expression demonstrated, and much colostrum easily expressed. Assisted Mom to support baby's head from ear to ear rather than back of head where he has bruising. Mom with small, slightly wide-spaced breasts.  Mom reports good breast changes in early pregnancy.   Baby fussy, and cueing when taken off the breast.  After repositioning baby and Mom, baby brought to breast with Mom sandwiching at base of breast.  Baby would take a few sucks before slipping repeated to nipple and dimpling in with his cheeks.  After several attempts, initiated a 20 mm nipple shield.  Taught Mom and FOB how to apply the nipple shield to pull nipple into shield.  Baby able to attain a deep areolar latch and multiple and regular swallows identified.  FOB shown how to assess a deep, wide latch.  Mom taught how to use alternate breast compression to increase swallowing.   Talked about pumping.  Mom exhausted.  RN given report and asked her to pass along need to set up DEBP and assist Mom with double pumping after every other feeding.    Mom encouraged to hand expression into snappy container, and curved tip syringe left with Mom to use to instill any EBM expressed.  Encouraged Mom to keep baby STS as much as possible, and feed baby on cue.  Mom to ask for assistance prn.  Lactation brochure left with Mom.  Mom aware of IP and OP lactation services available to her.    Maternal Data Formula Feeding for Exclusion: No Has patient been taught Hand Expression?: Yes Does the  patient have breastfeeding experience prior to this delivery?: No  Feeding Feeding Type: Breast Fed Length of feed: 10 min(on and off)  LATCH Score Latch: Grasps breast easily, tongue down, lips flanged, rhythmical sucking.  Audible Swallowing: Spontaneous and intermittent  Type of Nipple: Everted at rest and after stimulation  Comfort (Breast/Nipple): Soft / non-tender  Hold (Positioning): Assistance needed to correctly position infant at breast and maintain latch.  LATCH Score: 9  Interventions Interventions: Breast feeding basics reviewed;Assisted with latch;Skin to skin;Breast massage;Hand express;Adjust position;Support pillows;Position options;Expressed milk;Breast compression  Lactation Tools Discussed/Used Tools: Nipple Shields Nipple shield size: 20 WIC Program: No   Consult Status Consult Status: Follow-up Date: 07/21/17 Follow-up type: In-patient    Meghan Ibarra, Meghan Ibarra 07/20/2017, 5:05 PM

## 2017-07-20 NOTE — Progress Notes (Signed)
Dr Alysia PennaErvin at bedside doing repair

## 2017-07-20 NOTE — Anesthesia Pain Management Evaluation Note (Signed)
  [facility] Pain Management Visit Note  Patient: Meghan PortelaBrittany Brigance, 27 y.o., female  "Hello [facility] am a [facility]. We have an anesthesia team available at all times to provide care throughout the hospital, including epidural management and anesthesia for C-section. I don't know your plan for the delivery whether it a natural birth, water birth, IV sedation, nitrous supplementation, doula or epidural, but we want to meet your pain goals."   1.Was your pain managed to your expectations on prior hospitalizations?   No prior hospitalizations  2.What is your expectation for pain management during this hospitalization?     Epidural  3.How can we help you reach that goal? Epidural being placed during consult.  Record the patient's initial score and the patient's pain goal.   Pain: 10  Pain Goal: 1 [facility] wants you to be able to say your pain was always managed very well.  Indiana Pechacek 07/20/2017

## 2017-07-20 NOTE — MAU Note (Signed)
Pt reports contractions every 2-3 mins since 8pm. Pt denies LOF or vaginal bleeding. Some occasional bloody show. Reports good fetal movement. States cervix was 1cm on 5/31

## 2017-07-20 NOTE — Progress Notes (Signed)
pushing

## 2017-07-21 ENCOUNTER — Encounter (HOSPITAL_COMMUNITY): Payer: Self-pay | Admitting: *Deleted

## 2017-07-21 MED ORDER — RHO D IMMUNE GLOBULIN 1500 UNIT/2ML IJ SOSY
300.0000 ug | PREFILLED_SYRINGE | Freq: Once | INTRAMUSCULAR | Status: AC
Start: 2017-07-21 — End: 2017-07-21
  Administered 2017-07-21: 300 ug via INTRAVENOUS
  Filled 2017-07-21: qty 2

## 2017-07-21 NOTE — Progress Notes (Signed)
POSTPARTUM PROGRESS NOTE  Post Partum Day 1  Subjective:  Meghan Ibarra is a 27 y.o. G1P1001 s/p SVD with 4th degree laceration at 5359w3d.  No acute events overnight.  Pt denies problems with ambulating, voiding or po intake.  She denies nausea or vomiting.  Pain is well controlled.  She has not had flatus. She has not had bowel movement.  Lochia Moderate.   Objective: Blood pressure 125/85, pulse 87, temperature 98.4 F (36.9 C), temperature source Oral, resp. rate 14, height 5\' 7"  (1.702 m), weight 216 lb (98 kg), last menstrual period 10/10/2016, SpO2 98 %, unknown if currently breastfeeding.  Physical Exam:  General: alert, cooperative and no distress Chest: no respiratory distress Heart:regular rate, distal pulses intact Abdomen: soft, nontender, U/1 Uterine Fundus: firm, appropriately tender DVT Evaluation: No calf swelling or tenderness Extremities: No edema Laceration: approximated, healing well- swelling noted with ice pack in place  Recent Labs    07/20/17 0200  HGB 11.8*  HCT 37.7    Assessment/Plan: Meghan PortelaBrittany Matin is a 27 y.o. G1P1001 s/p SVD with 4th degree laceration at 5559w3d   PPD#1 - Doing well Contraception: Nexplanon  Feeding: Breast Dispo: Plan for discharge tomorrow.   LOS: 1 day   Mcneil SoberRogers, Karalynn Cottone C, CNM 07/21/2017, 6:35 AM

## 2017-07-21 NOTE — Anesthesia Postprocedure Evaluation (Signed)
Anesthesia Post Note  Patient: Meghan Ibarra  Procedure(s) Performed: AN AD HOC EPIDURAL     Patient location during evaluation: Mother Baby Anesthesia Type: Epidural Level of consciousness: awake and alert Pain management: pain level controlled Vital Signs Assessment: post-procedure vital signs reviewed and stable Respiratory status: spontaneous breathing Cardiovascular status: stable Postop Assessment: epidural receding and patient able to bend at knees Anesthetic complications: no    Last Vitals:  Vitals:   07/21/17 0216 07/21/17 0503  BP: 126/82 125/85  Pulse: 92 87  Resp: 16 14  Temp: 36.8 C 36.9 C  SpO2: 98% 98%    Last Pain:  Vitals:   07/21/17 0950  TempSrc:   PainSc: 3    Pain Goal: Patients Stated Pain Goal: 3 (07/21/17 [id number])               Meghan Ibarra,Meghan Ibarra

## 2017-07-22 LAB — RH IG WORKUP (INCLUDES ABO/RH)
ABO/RH(D): B NEG
FETAL SCREEN: NEGATIVE
Gestational Age(Wks): 40.3
Unit division: 0

## 2017-07-22 MED ORDER — LIDOCAINE HCL (PF) 1 % IJ SOLN
INTRAMUSCULAR | Status: DC | PRN
  Administered 2017-07-20 (×2): 5 mL via EPIDURAL

## 2017-07-22 MED ORDER — IBUPROFEN 600 MG PO TABS: 600.0000 mg | ORAL_TABLET | Freq: Four times a day (QID) | ORAL | 0 refills | Status: AC

## 2017-07-22 MED ORDER — OXYCODONE HCL 10 MG PO TABS: 10.0000 mg | ORAL_TABLET | ORAL | 0 refills | Status: DC | PRN

## 2017-07-22 MED ORDER — OXYCODONE HCL 5 MG PO TABS: 5.0000 mg | ORAL_TABLET | Freq: Four times a day (QID) | ORAL | 0 refills | Status: DC | PRN

## 2017-07-22 MED ORDER — FENTANYL 2.5 MCG/ML BUPIVACAINE 1/10 % EPIDURAL INFUSION (WH - ANES)
INTRAMUSCULAR | Status: DC | PRN
  Administered 2017-07-20: 14 mL/h via EPIDURAL

## 2017-07-22 NOTE — Progress Notes (Signed)
POSTPARTUM PROGRESS NOTE  Post Partum Day 2 Subjective:  Meghan Ibarra is a 27 y.o. G1P1001 176w3d s/p SVD.  No acute events overnight.  Pt denies problems with ambulating, voiding or po intake.  She denies nausea or vomiting.  Pain is well controlled.  She has had flatus. She has had bowel movement.  Lochia Minimal.   Objective: Blood pressure (!) 140/91, pulse 70, temperature 97.9 F (36.6 C), resp. rate 16, height 5\' 7"  (1.702 m), weight 216 lb (98 kg), last menstrual period 10/10/2016, SpO2 99 %, unknown if currently breastfeeding.  Physical Exam:  General: alert, cooperative and no distress Lochia:normal flow Chest: CTAB Heart: RRR no m/r/g Abdomen: +BS, soft, nontender,  Uterine Fundus: firm DVT Evaluation: No calf swelling or tenderness Extremities: no edema  Recent Labs    07/20/17 0200  HGB 11.8*  HCT 37.7    Assessment/Plan:  ASSESSMENT: Meghan Ibarra is a 27 y.o. G1P1001 566w3d s/p SVD  Discharge home   LOS: 2 days   Garnette GunnerAaron B Thompson, MD 07/22/2017, 8:42 AM

## 2017-07-22 NOTE — Lactation Note (Signed)
This note was copied from a baby's chart. Lactation Consultation Note: When I arrived in room, mother was breastfeeding infant in football hold on the rt breast. Mother reports that [facility]. Assist mother with cross cradle hold and infant latched on the left breast. Mother taught to latch with an off sided latch technique. Infant lips are widely flanged. Mother taught to listen and observed for swallows. Infant was observed with good burst of rhythmic suckling and frequent swallows.  Mother advised to use good breast support and good alignment. Advised mother to cue base feed and breastfeed at least 8-12 times in 24 hours. Discussed cluster feeding and infants need to do frequent skin to skin. Discussed treatment and prevention of engorgement. Discussed hand expression and breast compression. Mother has a harmony hand pump and plans to purchase an electric pump. Discussed supply and demand. Mother had questions about when to start pumping. Advised to begin post pumping if infant looses any more weight. Discussed hand expression after a feeding and spoon feed infant if unable to rouse for a good feeding. Mother to page for latch assistance if needed. She plans discharge after infants circumcision this afternoon. Mother is aware of available LC services , BFSG'S and outpatient services.   Patient Name: Boy Wyatt PortelaBrittany Popper MVHQI'OToday's Date: 07/22/2017 Reason for consult: Follow-up assessment   Maternal Data    Feeding Feeding Type: Breast Fed Length of feed: 20 min  LATCH Score Latch: Grasps breast easily, tongue down, lips flanged, rhythmical sucking.  Audible Swallowing: Spontaneous and intermittent  Type of Nipple: Everted at rest and after stimulation  Comfort (Breast/Nipple): Filling, red/small blisters or bruises, mild/mod discomfort  Hold (Positioning): Assistance needed to correctly position infant at breast and maintain latch.  LATCH Score:  8  Interventions Interventions: Assisted with latch;Skin to skin;Breast massage;Hand express;Breast compression;Adjust position;Support pillows;Position options;Expressed milk;Shells;Comfort gels;Hand pump  Lactation Tools Discussed/Used     Consult Status Consult Status: Complete    Michel BickersKendrick, Bunnie Rehberg McCoy 07/22/2017, 11:11 AM

## 2017-07-22 NOTE — Lactation Note (Signed)
This note was copied from a baby's chart. Lactation Consultation Note Baby 40 hrs old. LC heard baby crying frequently. Baby cluster feeding.  Mom has short shaft compressible nipples w/positional stripes. Baby screaming, rapid breathing. In football position w/higher pillows positioned at mom assisted in latching. Mom stated it hurt at first then ok. Chin tug demonstrated. Mom states she will latch the baby w/wide flange then he clamps down on the breast. LC assessed suck w/gloved finger. Had coordinated suck.  When latched to breast, noted swallows frequently. encouraged mom to assess for transfer. Mom excited to see difference in breast after feeding.  Mom has #20 NS, not wearing while feeding. Mom states she didn't want the baby to be dependant on them. Shells given to wear between feedings.  Comfort gels given for after feedings.  Mom tearful when first came into rm. Baby cluster feeding and fussing, mom is tired and sore.  Comforted mom praising her for her colostrum and how hard she is working to [employer] and she is doing a good job in feeding her baby on demand.  Encouraged breast massage at intervals during feeding to assist in transfer. LC concerned mom isn't using NS d/t semi flat nipples. Nipples will not be compressible when milk comes in. Encouraged to stimulate nipples prior to latching and making sure baby's mouth is wide open before latching. Encouraged to call for assistance or questions.  Patient Name: Meghan Wyatt PortelaBrittany Ibarra [id number]'XToday's Date: 07/22/2017 Reason for consult: Follow-up assessment;Nipple pain/trauma   Maternal Data    Feeding Feeding Type: Breast Fed Length of feed: 10 min(still BF)  LATCH Score Latch: Grasps breast easily, tongue down, lips flanged, rhythmical sucking.  Audible Swallowing: Spontaneous and intermittent  Type of Nipple: Everted at rest and after stimulation(very short shaft)  Comfort (Breast/Nipple): Filling, red/small blisters or bruises, mild/mod  discomfort  Hold (Positioning): Assistance needed to correctly position infant at breast and maintain latch.  LATCH Score: 8  Interventions Interventions: Breast feeding basics reviewed;Support pillows;Assisted with latch;Position options;Skin to skin;Breast massage;Hand express;Shells;Pre-pump if needed;Comfort gels;DEBP;Breast compression;Adjust position  Lactation Tools Discussed/Used Tools: Shells;Pump;Coconut oil;Comfort gels;Nipple Juanita LasterShields Shell Type: Inverted Breast pump type: Double-Electric Breast Pump Pump Review: Setup, frequency, and cleaning;Milk Storage Initiated by:: RN Date initiated:: 07/21/17   Consult Status Consult Status: Follow-up Date: 07/22/17 Follow-up type: In-patient    Harshita Bernales, Diamond NickelLAURA G 07/22/2017, 3:03 AM

## 2017-07-22 NOTE — Discharge Summary (Addendum)
OB Discharge Summary     Patient Name: Meghan Ibarra DOB: Nov 18, 1990 MRN: [id number]  Date of admission: 07/20/2017 Delivering MD: Raelyn MoraAWSON, ROLITTA   Date of discharge: 07/22/2017  Admitting diagnosis: 40.3 WEEKS CTX Intrauterine pregnancy: 7413w3d     Secondary diagnosis:  Active Problems:   Post term pregnancy at [redacted] weeks gestation   Gestational hypertension  Additional problems: None     Discharge diagnosis: Term Pregnancy Delivered                                                                                                Post partum procedures:None  Augmentation: Pitocin  Complications: None  Hospital course:  Onset of Labor With Vaginal Delivery     27 y.o. yo G1P1001 at 6513w3d was admitted in Latent Labor on 07/20/2017. Patient had an uncomplicated labor course as follows:  Membrane Rupture Time/Date: 8:32 AM ,07/20/2017   Intrapartum Procedures: Episiotomy:                                           Lacerations:  4th degree [5]  Patient had a delivery of a Viable infant. 07/20/2017  Information for the patient's newborn:  Meghan Ibarra, Boy Anah [[id number]]  Delivery Method: Vag-Spont    Pateint had an uncomplicated postpartum course.  She is ambulating, tolerating a regular diet, passing flatus, and urinating well. Patient is discharged home in stable condition on 07/22/17.   Physical exam  Vitals:   07/21/17 0216 07/21/17 0503 07/21/17 1800 07/22/17 0533  BP: 126/82 125/85 127/83 (!) 140/91  Pulse: 92 87 89 70  Resp: 16 14 16 16   Temp: 98.3 F (36.8 C) 98.4 F (36.9 C) 98.3 F (36.8 C) 97.9 F (36.6 C)  TempSrc: Oral Oral Oral   SpO2: 98% 98% 99% 99%  Weight:      Height:       General: alert Lochia: appropriate Uterine Fundus: firm Incision: N/A DVT Evaluation: No evidence of DVT seen on physical exam. Labs: Lab Results  Component Value Date   WBC 11.5 (H) 07/20/2017   HGB 11.8 (L) 07/20/2017   HCT 37.7 07/20/2017   MCV 91.5 07/20/2017   PLT 235  07/20/2017   CMP Latest Ref Rng & Units 07/20/2017  Glucose 65 - 99 mg/dL 82  BUN 6 - 20 mg/dL 10  Creatinine 7.820.44 - 9.561.00 mg/dL 2.130.69  Sodium 086135 - 578145 mmol/L 135  Potassium 3.5 - 5.1 mmol/L 4.2  Chloride 101 - 111 mmol/L 106  CO2 22 - 32 mmol/L 18(L)  Calcium 8.9 - 10.3 mg/dL 8.9  Total Protein 6.5 - 8.1 g/dL 6.8  Total Bilirubin 0.3 - 1.2 mg/dL 0.6  Alkaline Phos 38 - 126 U/L 205(H)  AST 15 - 41 U/L 23  ALT 14 - 54 U/L 13(L)    Discharge instruction: per After Visit Summary and "Baby and Me Booklet".  After visit meds:  Allergies as of 07/22/2017   No Known Allergies     Medication List  TAKE these medications   aspirin EC 81 MG tablet Take 1 tablet (81 mg total) by mouth daily.   ibuprofen 600 MG tablet Commonly known as:  ADVIL,MOTRIN Take 1 tablet (600 mg total) by mouth every 6 (six) hours.   prenatal multivitamin Tabs tablet Take 1 tablet by mouth daily at 12 noon.       Diet: routine diet  Activity: Advance as tolerated. Pelvic rest for 6 weeks.   Outpatient follow up:4 weeks Follow up Appt: Future Appointments  Date Time Provider Department Center  07/27/2017  2:35 PM Conan Bowens, MD [facility]  08/26/2017  8:55 AM Conan Bowens, MD [facility]   Follow up Visit:No follow-ups on file.  Postpartum contraception: Nexplanon  Newborn Data: Live born female  Birth Weight: 8 lb 5.7 oz (3790 g) APGAR: 8, 9  Newborn Delivery   Birth date/time:  07/20/2017 11:00:00 Delivery type:  Vaginal, Spontaneous     Baby Feeding: Breast Disposition:home with mother   07/22/2017 Garnette Gunner, MD  OB FELLOW DISCHARGE ATTESTATION  I have seen and examined this patient and agree with above documentation in the resident's note.   Frederik Pear, MD OB Fellow

## 2017-07-22 NOTE — Progress Notes (Signed)
Called Dr. Doroteo GlassmanPhelps with BP results taken just before 1300. BP=130/91 R arm and BP= 124/89 L arm. Dr. Doroteo GlassmanPhelps stated she is to have a BP check in one week and that the Ms Meghan Ibarra could be discharged today.

## 2017-07-22 NOTE — Anesthesia Procedure Notes (Signed)
Epidural Patient location during procedure: OB Start time: 07/20/2017 8:28 AM End time: 07/20/2017 8:39 AM  Staffing Anesthesiologist: Heather RobertsSinger, Alessio Bogan, MD Performed: anesthesiologist   Preanesthetic Checklist Completed: patient identified, site marked, pre-op evaluation, timeout performed, IV checked, risks and benefits discussed and monitors and equipment checked  Epidural Patient position: sitting Prep: DuraPrep Patient monitoring: heart rate, cardiac monitor, continuous pulse ox and blood pressure Approach: midline Location: L2-L3 Injection technique: LOR saline  Needle:  Needle type: Tuohy  Needle gauge: 17 G Needle length: 9 cm Needle insertion depth: 7 cm Catheter size: 20 Guage Catheter at skin depth: 11 cm Test dose: negative and Other  Assessment Events: blood not aspirated, injection not painful, no injection resistance and negative IV test  Additional Notes Informed consent obtained prior to proceeding including risk of failure, 1% risk of PDPH, risk of minor discomfort and bruising.  Discussed rare but serious complications including epidural abscess, permanent nerve injury, epidural hematoma.  Discussed alternatives to epidural analgesia and patient desires to proceed.  Timeout performed pre-procedure verifying patient name, procedure, and platelet count.  Patient tolerated procedure well. Procedure documented late.

## 2017-07-22 NOTE — Anesthesia Preprocedure Evaluation (Signed)
Anesthesia Evaluation  Patient identified by MRN, date of birth, ID band Patient awake    Reviewed: Allergy & Precautions, NPO status , Patient's Chart, lab work & pertinent test results  History of Anesthesia Complications Negative for: history of anesthetic complications  Airway Mallampati: II  TM Distance: >3 FB Neck ROM: Full    Dental no notable dental hx. (+) Dental Advisory Given   Pulmonary neg pulmonary ROS,    Pulmonary exam normal        Cardiovascular hypertension, Normal cardiovascular exam     Neuro/Psych negative neurological ROS  negative psych ROS   GI/Hepatic negative GI ROS, Neg liver ROS,   Endo/Other  negative endocrine ROS  Renal/GU negative Renal ROS  negative genitourinary   Musculoskeletal negative musculoskeletal ROS (+)   Abdominal   Peds negative pediatric ROS (+)  Hematology negative hematology ROS (+)   Anesthesia Other Findings   Reproductive/Obstetrics negative OB ROS                             Anesthesia Physical Anesthesia Plan  ASA: II  Anesthesia Plan: Epidural   Post-op Pain Management:    Induction:   PONV Risk Score and Plan:   Airway Management Planned:   Additional Equipment:   Intra-op Plan:   Post-operative Plan:   Informed Consent: I have reviewed the patients History and Physical, chart, labs and discussed the procedure including the risks, benefits and alternatives for the proposed anesthesia with the patient or authorized representative who has indicated his/her understanding and acceptance.   Dental advisory given  Plan Discussed with: Anesthesiologist  Anesthesia Plan Comments: (Delayed documentation.)        Anesthesia Quick Evaluation

## 2017-07-24 ENCOUNTER — Inpatient Hospital Stay (HOSPITAL_COMMUNITY): Admission: RE | Admit: 2017-07-24 | Payer: BLUE CROSS/BLUE SHIELD | Source: Ambulatory Visit

## 2017-07-27 ENCOUNTER — Encounter: Payer: Self-pay | Admitting: Obstetrics and Gynecology

## 2017-07-27 ENCOUNTER — Ambulatory Visit (INDEPENDENT_AMBULATORY_CARE_PROVIDER_SITE_OTHER): Payer: BLUE CROSS/BLUE SHIELD | Admitting: Obstetrics and Gynecology

## 2017-07-27 DIAGNOSIS — R030 Elevated blood-pressure reading, without diagnosis of hypertension: Secondary | ICD-10-CM

## 2017-07-27 NOTE — Progress Notes (Signed)
   OBSTETRICY OFFICE FOLLOW UP NOTE  History:  27 y.o. G1P1001 here today for follow up for 4th degree laceration s/p SVD on 07/20/17.  Low grade fever to 100.7 last night that improved with ibuprofen. Eating, drinking, stooling without issue. Pain is fairly well controlled. Denies other issues. Still with some light bleeding. Has been checking BP at home and is 130s/80s.  Current Outpatient Medications:  .  ibuprofen (ADVIL,MOTRIN) 600 MG tablet, Take 1 tablet (600 mg total) by mouth every 6 (six) hours., Disp: 30 tablet, Rfl: 0 .  oxyCODONE (OXY IR/ROXICODONE) 5 MG immediate release tablet, Take 1 tablet (5 mg total) by mouth every 6 (six) hours as needed., Disp: 20 tablet, Rfl: 0 .  Prenatal Vit-Fe Fumarate-FA (PRENATAL MULTIVITAMIN) TABS tablet, Take 1 tablet by mouth daily at 12 noon., Disp: , Rfl:  .  aspirin EC 81 MG tablet, Take 1 tablet (81 mg total) by mouth daily. (Patient not taking: Reported on 07/27/2017), Disp: 30 tablet, Rfl: 5  The following portions of the patient's history were reviewed and updated as appropriate: allergies, current medications, past family history, past medical history, past social history, past surgical history and problem list.   Review of Systems:  Pertinent items noted in HPI and remainder of comprehensive ROS otherwise negative.   Objective:  Physical Exam BP (!) 128/92   Pulse 94   Wt 192 lb 12.8 oz (87.5 kg)   Breastfeeding? No   BMI 30.20 kg/m  CONSTITUTIONAL: Well-developed, well-nourished female in no acute distress.  HENT:  Normocephalic, atraumatic. External right and left ear normal. Oropharynx is clear and moist EYES: Conjunctivae and EOM are normal. Pupils are equal, round, and reactive to light. No scleral icterus.  NECK: Norm al range of motion, supple, no masses SKIN: Skin is warm and dry. No rash noted. Not diaphoretic. No erythema. No pallor. NEUROLOGIC: Alert and oriented to person, place, and time. Normal reflexes, muscle tone  coordination. No cranial nerve deficit noted. PSYCHIATRIC: Normal mood and affect. Normal behavior. Normal judgment and thought content. CARDIOVASCULAR: Normal heart rate noted RESPIRATORY: Effort and breath sounds normal, no problems with respiration noted ABDOMEN: Soft, no distention noted.   PELVIC: intact perineal laceration with sutures present, laceration well approximated with no breakdown, pus, or other signs of infection present, rectal exam notes intact mucosa with no occult tear present MUSCULOSKELETAL: Normal range of motion. No edema noted.  Labs and Imaging No results found.  Assessment & Plan:  1. Fourth degree perineal laceration during delivery Appears to be healing very well, reviewed s/s infection To start on colace  2. Elevated BP without diagnosis of hypertension Recheck 128/92 Will cont to check at home Repeat BP next week   Routine preventative health maintenance measures emphasized. Please refer to After Visit Summary for other counseling recommendations.   Return in about 1 week (around 08/03/2017) for OB visit (MD).   K. Therese SarahMeryl Davis, M.D. Attending Obstetrician & Gynecologist, [facility], [facility]

## 2017-07-27 NOTE — Patient Instructions (Addendum)
Use Colace, also called docusate sodium, 100 mg by mouth twice per day.

## 2017-08-04 ENCOUNTER — Ambulatory Visit: Payer: BLUE CROSS/BLUE SHIELD

## 2017-08-07 ENCOUNTER — Ambulatory Visit (INDEPENDENT_AMBULATORY_CARE_PROVIDER_SITE_OTHER): Payer: BLUE CROSS/BLUE SHIELD | Admitting: Obstetrics and Gynecology

## 2017-08-07 ENCOUNTER — Encounter: Payer: Self-pay | Admitting: Obstetrics and Gynecology

## 2017-08-07 NOTE — Progress Notes (Signed)
Meghan Ibarra is here for second recheck of her 4 degree laceration. She has no complaints today. BM's without problems. No problems with control. No more pain.  PE AF VSS Lungs clear Heart RRR Abd soft + BS GU Nl EGBUS perineal body healing well no evidence of  infection good anal sphincter tone.  A/P 4 degree laceration check  Doing well. Continue with Colace. Pelvic rest. F/U in 4 weeks for PP visit.

## 2017-08-07 NOTE — Patient Instructions (Signed)

## 2017-08-26 ENCOUNTER — Ambulatory Visit (INDEPENDENT_AMBULATORY_CARE_PROVIDER_SITE_OTHER): Payer: BLUE CROSS/BLUE SHIELD | Admitting: Obstetrics and Gynecology

## 2017-08-26 ENCOUNTER — Encounter: Payer: Self-pay | Admitting: Obstetrics and Gynecology

## 2017-08-26 DIAGNOSIS — Z3400 Encounter for supervision of normal first pregnancy, unspecified trimester: Secondary | ICD-10-CM

## 2017-08-26 DIAGNOSIS — Z3046 Encounter for surveillance of implantable subdermal contraceptive: Secondary | ICD-10-CM | POA: Diagnosis not present

## 2017-08-26 DIAGNOSIS — IMO0001 Reserved for inherently not codable concepts without codable children: Secondary | ICD-10-CM

## 2017-08-26 DIAGNOSIS — Z1389 Encounter for screening for other disorder: Secondary | ICD-10-CM

## 2017-08-26 DIAGNOSIS — O139 Gestational [pregnancy-induced] hypertension without significant proteinuria, unspecified trimester: Secondary | ICD-10-CM

## 2017-08-26 DIAGNOSIS — Z3202 Encounter for pregnancy test, result negative: Secondary | ICD-10-CM | POA: Diagnosis not present

## 2017-08-26 LAB — POCT PREGNANCY, URINE: PREG TEST UR: NEGATIVE

## 2017-08-26 MED ORDER — ETONOGESTREL 68 MG ~~LOC~~ IMPL
68.0000 mg | DRUG_IMPLANT | Freq: Once | SUBCUTANEOUS | Status: AC
  Administered 2017-08-26: 68 mg via SUBCUTANEOUS

## 2017-08-26 NOTE — Addendum Note (Signed)
Addended by: Ernestina PatchesAPEL, Tonda Wiederhold S on: 08/26/2017 11:54 AM   Modules accepted: Orders

## 2017-08-26 NOTE — Progress Notes (Signed)
   Obstetrics/Postpartum Visit  Appointment Date: 08/26/2017  [facility]  Primary Care Provider: Patient, No Pcp Per  Chief Complaint:  Chief Complaint  Patient presents with  . Postpartum Care    History of Present Illness: Meghan Ibarra is a 27 y.o. Caucasian G1P1001 (No LMP recorded.), seen for the above chief complaint. Her past medical history is significant for gestational HTN.  She is s/p SVD on 07/20/17 at 40 weeks; she was discharged to home on PPD#2. Pregnancy complicated by gestational HTN and 4th degree laceration.  Complains of occasional constipation  Vaginal bleeding or discharge: No , hasn't had a period yet Breast or formula feeding: bottle Intercourse: No  Contraception: Nexplanon today PP depression s/s: No  Any bowel or bladder issues: occasional constipation Pap smear: no abnormalities (date: 12/2016)  Review of Systems: Positive for n/a.   Her 12 point review of systems is negative or as noted in the History of Present Illness.  Patient Active Problem List   Diagnosis Date Noted  . Fourth degree perineal laceration 08/07/2017  . Post term pregnancy at [redacted] weeks gestation 07/20/2017  . Gestational hypertension 07/20/2017  . Rh negative, antepartum 01/16/2017  . Supervision of normal first pregnancy, antepartum 01/07/2017    Medications Meghan Ibarra had no medications administered during this visit. Current Outpatient Medications  Medication Sig Dispense Refill  . ibuprofen (ADVIL,MOTRIN) 600 MG tablet Take 1 tablet (600 mg total) by mouth every 6 (six) hours. 30 tablet 0  . Prenatal Vit-Fe Fumarate-FA (PRENATAL MULTIVITAMIN) TABS tablet Take 1 tablet by mouth daily at 12 noon.     No current facility-administered medications for this visit.     Allergies Patient has no known allergies.  Physical Exam:  BP 124/86   Pulse 80   Wt 192 lb (87.1 kg)   Breastfeeding? No   BMI 30.07 kg/m  Body mass index is 30.07 kg/m. General  appearance: Well nourished, well developed female in no acute distress.  Cardiovascular: regular rate and rhythm Respiratory:  Clear to auscultation bilateral. Normal respiratory effort Abdomen: positive bowel sounds and no masses, hernias; diffusely non tender to palpation, non distended Breasts: not examined. Neuro/Psych:  Normal mood and affect.  Skin:  Warm and dry.   Pelvic exam: is not limited by body habitus EGBUS: within normal limits Vagina: sutures from laceration appear to be healing well, one 2 mm area of granulation tissue at perineum which was cauterized with silver nitrate, no separation of tissue noted, digital rectal exam shows intact rectal mucosa with no stitches palpable  PP Depression Screening: negative  Assessment: Patient is a 27 y.o. G1P1001 who is 5 weeks post partum from a SVD complicated by 4th degree laceration. She is doing very well. Desires Nexplanon for contraception, please see insertion note for details  Plan:   1. Fourth degree perineal laceration Healing well Return prn Cont colace  2. Supervision of normal first pregnancy, antepartum  3. Gestational hypertension, antepartum BP stable   RTC prn   K. Therese SarahMeryl Rylin Saez, M.D. Center for [facility]

## 2017-08-26 NOTE — Progress Notes (Signed)
     GYNECOLOGY OFFICE PROCEDURE NOTE  Meghan PortelaBrittany Ibarra is a 27 y.o. G1P1001 here for Nexplanon insertion.  Last pap smear was on 12/2016 and was normal.  No other gynecologic concerns. Denies unprotected intercourse within the last 14 days. UPT: negative  Reviewed risks of insertion of implant including risk of infection, bleeding, damage to surrounding tissues and organs, migration of implant, difficult removal. She verbalizes understanding and affirms desire to proceed. Consent signed.   Nexplanon Insertion Procedure Patient identified, informed consent performed, consent signed.   Patient does understand that irregular bleeding is a very common side effect of this medication. She was advised to have backup contraception for one week after placement. Pregnancy test in clinic today was negative.  An adequate timeout was performed.  Patient's left arm was prepped and draped in the usual sterile fashion. The ruler used to measure and mark insertion area.  Patient was prepped with alcohol swab and then injected with 3 ml of 1% lidocaine.  She was prepped with betadine, Nexplanon removed from packaging,  Device confirmed in needle, then inserted full length of needle and withdrawn per handbook instructions. Nexplanon was able to palpated in the patient's arm; patient palpated the insert herself. There was minimal blood loss.  Patient insertion site covered with guaze and a pressure bandage to reduce any bruising.  The patient tolerated the procedure well and was given post procedure instructions.   Device Info Exp: Nov/2021 Lot#: [id number]  K. Therese SarahMeryl Donis Pinder, M.D. Center for [facility]

## 2017-09-23 ENCOUNTER — Encounter: Payer: Self-pay | Admitting: Obstetrics and Gynecology

## 2017-11-11 ENCOUNTER — Telehealth: Payer: Self-pay | Admitting: Family Medicine

## 2017-11-11 ENCOUNTER — Encounter (HOSPITAL_COMMUNITY): Payer: Self-pay | Admitting: Emergency Medicine

## 2017-11-11 ENCOUNTER — Other Ambulatory Visit: Payer: Self-pay

## 2017-11-11 ENCOUNTER — Emergency Department (HOSPITAL_COMMUNITY)
Admission: EM | Admit: 2017-11-11 | Discharge: 2017-11-11 | Disposition: A | Discharge status: Home / Self Care | Payer: BLUE CROSS/BLUE SHIELD | Attending: Emergency Medicine | Admitting: Emergency Medicine

## 2017-11-11 DIAGNOSIS — F419 Anxiety disorder, unspecified: Secondary | ICD-10-CM | POA: Insufficient documentation

## 2017-11-11 DIAGNOSIS — G479 Sleep disorder, unspecified: Secondary | ICD-10-CM | POA: Diagnosis not present

## 2017-11-11 DIAGNOSIS — G4700 Insomnia, unspecified: Secondary | ICD-10-CM | POA: Diagnosis present

## 2017-11-11 LAB — COMPREHENSIVE METABOLIC PANEL
ALK PHOS: 91 U/L (ref 38–126)
ALT: 15 U/L (ref 0–44)
AST: 14 U/L — AB (ref 15–41)
Albumin: 4.3 g/dL (ref 3.5–5.0)
Anion gap: 8 (ref 5–15)
BUN: 12 mg/dL (ref 6–20)
CALCIUM: 9.5 mg/dL (ref 8.9–10.3)
CO2: 26 mmol/L (ref 22–32)
CREATININE: 0.82 mg/dL (ref 0.44–1.00)
Chloride: 108 mmol/L (ref 98–111)
GFR calc non Af Amer: >60 mL/min (ref >60)
Glucose, Bld: 94 mg/dL (ref 70–99)
Potassium: 3.9 mmol/L (ref 3.5–5.1)
SODIUM: 142 mmol/L (ref 135–145)
Total Bilirubin: 0.7 mg/dL (ref 0.3–1.2)
Total Protein: 8.1 g/dL (ref 6.5–8.1)

## 2017-11-11 LAB — URINALYSIS, ROUTINE W REFLEX MICROSCOPIC
BILIRUBIN URINE: NEGATIVE
Glucose, UA: NEGATIVE mg/dL
Ketones, ur: NEGATIVE mg/dL
LEUKOCYTES UA: NEGATIVE
Nitrite: NEGATIVE
Protein, ur: 100 mg/dL — AB
SPECIFIC GRAVITY, URINE: 1.012 (ref 1.005–1.030)
pH: 6 (ref 5.0–8.0)

## 2017-11-11 LAB — RAPID URINE DRUG SCREEN, HOSP PERFORMED
AMPHETAMINES: NOT DETECTED
BARBITURATES: NOT DETECTED
BENZODIAZEPINES: NOT DETECTED
Cocaine: NOT DETECTED
Opiates: NOT DETECTED
Tetrahydrocannabinol: NOT DETECTED

## 2017-11-11 LAB — CBC
HEMATOCRIT: 41.4 % (ref 36.0–46.0)
HEMOGLOBIN: 13 g/dL (ref 12.0–15.0)
MCH: 26.4 pg (ref 26.0–34.0)
MCHC: 31.4 g/dL (ref 30.0–36.0)
MCV: 84.1 fL (ref 78.0–100.0)
Platelets: 402 10*3/uL — ABNORMAL HIGH (ref 150–400)
RBC: 4.92 MIL/uL (ref 3.87–5.11)
RDW: 14.1 % (ref 11.5–15.5)
WBC: 7.6 10*3/uL (ref 4.0–10.5)

## 2017-11-11 LAB — I-STAT BETA HCG BLOOD, ED (MC, WL, AP ONLY)

## 2017-11-11 MED ORDER — HYDROXYZINE HCL 25 MG PO TABS
25.0000 mg | ORAL_TABLET | Freq: Once | ORAL | Status: AC
  Administered 2017-11-11: 25 mg via ORAL
  Filled 2017-11-11: qty 1

## 2017-11-11 MED ORDER — HYDROXYZINE HCL 25 MG PO TABS: 25.0000 mg | ORAL_TABLET | Freq: Four times a day (QID) | ORAL | 0 refills | Status: AC

## 2017-11-11 NOTE — ED Notes (Signed)
PT STATES BIRTH OF CHILD 3 1/2 MONTHS AGO. UNABLE TO SLEEP SINCE. MIND WILL NOT TURN OFF. TEARFUL DURING ASSESSMENT. PT DOES NOT HAVE PCP. ATTEMPTED TO GET ONE HOWEVER WILL BE AWHILE TO GET ONE. "UNABLE TO WAIT" "MY HAIR IS FALLING OUT" DENIES SI/HI"

## 2017-11-11 NOTE — Telephone Encounter (Signed)
Patient called this morning and stated she was Depressed, Asher Muir (the Child psychotherapist) is out of the office, I offered the next appt which is Monday 11-16-2017 she said that was to long and she need to be seen, I ask a nurse Bonita Quin) she advised me to tell the patient to go to [facility] ED the patient understood and said she will go.

## 2017-11-11 NOTE — ED Notes (Signed)
ED Provider at bedside.EDP MICHAEL

## 2017-11-11 NOTE — ED Triage Notes (Signed)
Patient here from home with complaints of insomnia x1 week. No hx. Recently gave birth almost 4 months ago. Denies taking any medications. States "I just cant turn my mind off at night".

## 2017-11-11 NOTE — ED Provider Notes (Signed)
[facility]-EMERGENCY DEPT Provider Note   CSN: [id number] Arrival date & time: 11/11/17  1052     History   Chief Complaint Chief Complaint  Patient presents with  . Insomnia  . Anxiety    HPI Meghan Ibarra is a 27 y.o. female who recently gave birth 3.5 months ago presents emergency department today for insomnia and anxiety.  Patient reports for the last 3-4 days she has had inability to have a full night sleep.  She notes that when she lies down she feels very anxious and at times feels like her heart is racing.  She reports that this only occurs when she tries to lie down at night.  She denies any increase in sadness or stresses at home.  She is here with her husband who is supportive.  She reports she has tried Zzzquil (without tylenol) and Unisom for her symptoms without any relief.  Patient denies history of same in the past.  She denies any SI/HI, visual or auditory hallucinations.  She denies any associated fever, chills, headache, neck stiffness, rash, chest pain, shortness of breath, cough, abdominal pain, nausea/vomiting/diarrhea, urinary symptoms.  No history of thyroid disorder.  She does report some loose stools this morning but no gross diarrhea.  No melena or hematochezia.  She denies any syncope.  Patient denies any exogenous estrogen use (has Nexplanon implant), recent surgery in the last 4 weeks, recent travel, trauma, immobilization, previous PE/DVT, hemoptysis, history of cancer, or lower extremity pain/swelling.  She denies any new medications.  She denies any alcohol or drug use.   HPI  Past Medical History:  Diagnosis Date  . Medical history non-contributory     Patient Active Problem List   Diagnosis Date Noted  . Fourth degree perineal laceration 08/07/2017  . Post term pregnancy at [redacted] weeks gestation 07/20/2017  . Gestational hypertension 07/20/2017  . Rh negative, antepartum 01/16/2017  . Supervision of normal first pregnancy,  antepartum 01/07/2017    Past Surgical History:  Procedure Laterality Date  . NO PAST SURGERIES       OB History    Gravida  1   Para  1   Term  1   Preterm      AB      Living  1     SAB      TAB      Ectopic      Multiple  0   Live Births  1            Home Medications    Prior to Admission medications   Medication Sig Start Date End Date Taking? Authorizing Provider  diphenhydrAMINE (SIMPLY SLEEP) 25 MG tablet Take 50 mg by mouth at bedtime as needed for sleep.   Yes [provider]  diphenhydrAMINE HCl (ZZZQUIL) 50 MG/30ML LIQD Take 30 mLs by mouth at bedtime as needed (sleep).   Yes [provider]  doxylamine, Sleep, (UNISOM) 25 MG tablet Take 25 mg by mouth at bedtime as needed for sleep.   Yes [provider]  etonogestrel (NEXPLANON) 68 MG IMPL implant 1 each by Subdermal route once.   Yes [provider]  ibuprofen (ADVIL,MOTRIN) 600 MG tablet Take 1 tablet (600 mg total) by mouth every 6 (six) hours. Patient not taking: Reported on 11/11/2017 07/22/17   Garnette Gunner, MD    Family History Family History  Problem Relation Age of Onset  . Diabetes Father   . Hypertension Father  Social History Social History   Tobacco Use  . Smoking status: Never Smoker  . Smokeless tobacco: Never Used  Substance Use Topics  . Alcohol use: No    Frequency: Never  . Drug use: No     Allergies   Patient has no known allergies.   Review of Systems Review of Systems  All other systems reviewed and are negative.    Physical Exam Updated Vital Signs BP (!) 137/95 (BP Location: Right Arm)   Pulse 92   Temp 98.2 F (36.8 C) (Oral)   Resp 16   SpO2 100%   Physical Exam  Constitutional: She appears well-developed and well-nourished.  HENT:  Head: Normocephalic and atraumatic.  Right Ear: External ear normal.  Left Ear: External ear normal.  Nose: Nose normal.  Mouth/Throat: Uvula is midline,  oropharynx is clear and moist and mucous membranes are normal. No tonsillar exudate.  No nystagmus.  No exophthalmos  Eyes: Pupils are equal, round, and reactive to light. Right eye exhibits no discharge. Left eye exhibits no discharge. No scleral icterus.  Neck: Trachea normal. Neck supple. No spinous process tenderness present. No neck rigidity. Normal range of motion present.  Cardiovascular: Normal rate, regular rhythm and intact distal pulses.  No murmur heard. Pulses:      Radial pulses are 2+ on the right side, and 2+ on the left side.       Dorsalis pedis pulses are 2+ on the right side, and 2+ on the left side.       Posterior tibial pulses are 2+ on the right side, and 2+ on the left side.  No lower extremity swelling or edema. Calves symmetric in size bilaterally.  Pulmonary/Chest: Effort normal and breath sounds normal. She exhibits no tenderness.  Abdominal: Soft. Bowel sounds are normal. There is no tenderness. There is no rebound and no guarding.  Musculoskeletal: She exhibits no edema.  Lymphadenopathy:    She has no cervical adenopathy.  Neurological: She is alert.  Speech clear. Follows commands. No facial droop. PERRLA. EOMI. Normal peripheral fields. CN III-XII intact.  Grossly moves all extremities 4 without ataxia. Coordination intact. Able and appropriate strength for age to upper and lower extremities bilaterally including grip strength & plantar flexion/dorsiflexion. Sensation to light touch intact bilaterally for upper and lower. Patellar deep tendon reflex 2+ and equal bilaterally. Normal finger to nose. No pronator drift. Normal gait.   Skin: Skin is warm, dry and intact. Capillary refill takes less than 2 seconds. No rash noted. She is not diaphoretic.  Psychiatric: She has a normal mood and affect.  Nursing note and vitals reviewed.    ED Treatments / Results  Labs (all labs ordered are listed, but only abnormal results are displayed) Labs Reviewed    COMPREHENSIVE METABOLIC PANEL - Abnormal; Notable for the following components:      Result Value   AST 14 (*)    All other components within normal limits  CBC - Abnormal; Notable for the following components:   Platelets 402 (*)    All other components within normal limits  URINALYSIS, ROUTINE W REFLEX MICROSCOPIC - Abnormal; Notable for the following components:   Color, Urine RED (*)    APPearance HAZY (*)    Hgb urine dipstick LARGE (*)    Protein, ur 100 (*)    Bacteria, UA FEW (*)    All other components within normal limits  RAPID URINE DRUG SCREEN, HOSP PERFORMED  I-STAT BETA HCG BLOOD, ED ([facility],  [facility], [facility] ONLY)    EKG None  Radiology No results found.  Procedures Procedures (including critical care time)  Medications Ordered in ED Medications  hydrOXYzine (ATARAX/VISTARIL) tablet 25 mg (has no administration in time range)     Initial Impression / Assessment and Plan / ED Course  I have reviewed the triage vital signs and the nursing notes.  Pertinent labs & imaging results that were available during my care of the patient were reviewed by me and considered in my medical decision making (see chart for details).     27 y.o. female with recent birth 3.5 months ago presents emergency department today for anxiety, palpitations, difficulty sleeping.  She denies any SI, HI, visual or auditory hallucinations.  Vital signs are reassuring on presentation.  Patient is PERC and Wells negative. The patient is resting comfortably, in no apparent distress and asymptomatic.  Labs, ECG and vital signs reviewed.  No exophthalmos, pregnancy test negative.  UA without obvious evidence of UTI, this is a dirty catch.  She is currently asymptomatic and will hold off on treating.  Stress reducing mechanisms discussed including caffeine intake.  She feels improvement with hydroxyzine.  Will give short prescription of outpatient.  Outpatient resources given.  Return precautions discussed.   Patient appears safe for discharge.  Case discussed and seen with Dr. Jeraldine Loots who is in agreement with plan.  Final Clinical Impressions(s) / ED Diagnoses   Final diagnoses:  Difficulty sleeping  Anxiety    ED Discharge Orders         Ordered    hydrOXYzine (ATARAX/VISTARIL) 25 MG tablet  Every 6 hours     11/11/17 1631           Princella Pellegrini 11/12/17 0041    Gerhard Munch, MD 11/12/17 1610    Gerhard Munch, MD 11/12/17 2330

## 2017-11-11 NOTE — Discharge Instructions (Signed)
Use resources to find outpatient resources.  Use medication as prescribed.  Follow up with your PCP. If you develop worsening or new concerning symptoms you can return to the emergency department for re-evaluation.

## 2017-11-11 NOTE — ED Notes (Signed)
ED Provider at bedside. EDP [place]
